# Patient Record
Sex: Male | Born: 1940 | Race: Black or African American | Hispanic: No | Marital: Married | Smoking: Former smoker
Health system: Southern US, Community
[De-identification: ages and names within clinical notes are randomized; demographics above are authoritative.]

## PROBLEM LIST (undated history)

## (undated) DIAGNOSIS — R600 Localized edema: Secondary | ICD-10-CM

## (undated) DIAGNOSIS — H9190 Unspecified hearing loss, unspecified ear: Secondary | ICD-10-CM

## (undated) DIAGNOSIS — E119 Type 2 diabetes mellitus without complications: Secondary | ICD-10-CM

## (undated) DIAGNOSIS — R21 Rash and other nonspecific skin eruption: Secondary | ICD-10-CM

## (undated) DIAGNOSIS — E785 Hyperlipidemia, unspecified: Secondary | ICD-10-CM

## (undated) DIAGNOSIS — I2510 Atherosclerotic heart disease of native coronary artery without angina pectoris: Secondary | ICD-10-CM

## (undated) DIAGNOSIS — I10 Essential (primary) hypertension: Secondary | ICD-10-CM

## (undated) DIAGNOSIS — E7800 Pure hypercholesterolemia, unspecified: Secondary | ICD-10-CM

## (undated) DIAGNOSIS — N401 Enlarged prostate with lower urinary tract symptoms: Secondary | ICD-10-CM

## (undated) DIAGNOSIS — N183 Chronic kidney disease, stage 3 unspecified: Secondary | ICD-10-CM

## (undated) DIAGNOSIS — C61 Malignant neoplasm of prostate: Secondary | ICD-10-CM

## (undated) DIAGNOSIS — L853 Xerosis cutis: Secondary | ICD-10-CM

## (undated) HISTORY — PX: CORONARY ANGIOPLASTY: SHX604

## (undated) HISTORY — PX: CATARACT EXTRACTION W/ INTRAOCULAR LENS  IMPLANT, BILATERAL: SHX1307

## (undated) HISTORY — PX: CARDIAC CATHETERIZATION: SHX172

## (undated) HISTORY — PX: CORONARY ANGIOPLASTY WITH STENT PLACEMENT: SHX49

## (undated) HISTORY — PX: PROSTATE BIOPSY: SHX241

---

## 1995-11-07 HISTORY — PX: CARDIAC CATHETERIZATION: SHX172

## 1998-03-23 ENCOUNTER — Inpatient Hospital Stay (HOSPITAL_COMMUNITY): Admission: EM | Admit: 1998-03-23 | Discharge: 1998-03-24 | Payer: Self-pay | Admitting: Emergency Medicine

## 2002-01-08 ENCOUNTER — Emergency Department (HOSPITAL_COMMUNITY): Admission: EM | Admit: 2002-01-08 | Discharge: 2002-01-08 | Payer: Self-pay | Admitting: Emergency Medicine

## 2002-01-08 ENCOUNTER — Encounter: Payer: Self-pay | Admitting: Emergency Medicine

## 2002-01-22 ENCOUNTER — Ambulatory Visit (HOSPITAL_COMMUNITY): Admission: RE | Admit: 2002-01-22 | Discharge: 2002-01-22 | Payer: Self-pay | Admitting: Pulmonary Disease

## 2002-01-22 ENCOUNTER — Encounter: Payer: Self-pay | Admitting: Pulmonary Disease

## 2002-06-03 ENCOUNTER — Inpatient Hospital Stay (HOSPITAL_COMMUNITY): Admission: EM | Admit: 2002-06-03 | Discharge: 2002-06-04 | Payer: Self-pay | Admitting: Emergency Medicine

## 2002-06-03 ENCOUNTER — Encounter: Payer: Self-pay | Admitting: Emergency Medicine

## 2002-06-04 DIAGNOSIS — Z955 Presence of coronary angioplasty implant and graft: Secondary | ICD-10-CM

## 2002-06-04 HISTORY — DX: Presence of coronary angioplasty implant and graft: Z95.5

## 2002-12-08 ENCOUNTER — Encounter: Payer: Self-pay | Admitting: Cardiology

## 2002-12-08 ENCOUNTER — Observation Stay (HOSPITAL_COMMUNITY): Admission: EM | Admit: 2002-12-08 | Discharge: 2002-12-09 | Payer: Self-pay | Admitting: *Deleted

## 2003-01-29 ENCOUNTER — Emergency Department (HOSPITAL_COMMUNITY): Admission: EM | Admit: 2003-01-29 | Discharge: 2003-01-29 | Payer: Self-pay | Admitting: Emergency Medicine

## 2003-07-27 ENCOUNTER — Encounter: Payer: Self-pay | Admitting: Emergency Medicine

## 2003-07-27 ENCOUNTER — Observation Stay (HOSPITAL_COMMUNITY): Admission: EM | Admit: 2003-07-27 | Discharge: 2003-07-28 | Payer: Self-pay | Admitting: Emergency Medicine

## 2003-07-28 ENCOUNTER — Encounter: Payer: Self-pay | Admitting: Pulmonary Disease

## 2005-03-08 ENCOUNTER — Ambulatory Visit (HOSPITAL_COMMUNITY): Admission: RE | Admit: 2005-03-08 | Discharge: 2005-03-09 | Payer: Self-pay | Admitting: Cardiology

## 2005-10-11 ENCOUNTER — Encounter: Admission: RE | Admit: 2005-10-11 | Discharge: 2005-10-11 | Payer: Self-pay | Admitting: Occupational Medicine

## 2006-05-25 ENCOUNTER — Emergency Department (HOSPITAL_COMMUNITY): Admission: EM | Admit: 2006-05-25 | Discharge: 2006-05-25 | Payer: Self-pay | Admitting: Emergency Medicine

## 2006-05-28 ENCOUNTER — Observation Stay (HOSPITAL_COMMUNITY): Admission: AD | Admit: 2006-05-28 | Discharge: 2006-05-29 | Payer: Self-pay | Admitting: Otolaryngology

## 2008-03-11 ENCOUNTER — Encounter: Admission: RE | Admit: 2008-03-11 | Discharge: 2008-03-17 | Payer: Self-pay | Admitting: Pulmonary Disease

## 2010-01-07 ENCOUNTER — Emergency Department (HOSPITAL_COMMUNITY): Admission: EM | Admit: 2010-01-07 | Discharge: 2010-01-07 | Payer: Self-pay | Admitting: Family Medicine

## 2011-03-24 NOTE — Discharge Summary (Signed)
NAME:  [name], [name]                            ACCOUNT NO.:  [id number]   MEDICAL RECORD NO.:  [id number]                   PATIENT TYPE:  INP   LOCATION:  6531                                 FACILITY:  [facility]   PHYSICIAN:  Madaline Savage, M.D.             DATE OF BIRTH:  1941/04/09   DATE OF ADMISSION:  06/03/2002  DATE OF DISCHARGE:  06/04/2002                                 DISCHARGE SUMMARY   DISCHARGE DIAGNOSES:  1. Unstable angina, catheterization this admission revealing high grade     right coronary artery disease treated with tandem stents.  2. Noninsulin-dependent diabetes with retinopathy.  3. Hypertension, improved control at discharge.  4. Treated hyperlipidemia.  5. Family history of coronary artery disease.   HOSPITAL COURSE:  The patient is a 70 year old male with history of coronary  disease.  He has had previous catheterizations by Dr. Elsie Lincoln in the mid 90s  that showed moderate RCA disease.  He presented 06/03/02 with chest pain  that sounded somewhat atypical being sharp on the left side of his chest  and localized.  On further review the patient did admit to some fatigue and  some intermittent arm discomfort over the last couple weeks.  He has  multiple risk factors for coronary disease and it was elected to proceed  with diagnostic catheterization.  He was started on aspirin and a statin and  ACE inhibitor was added.  A catheterization done 06/03/02 indicated a 90%  RCA.  This was dilated and stented.  He also had a proximal RCA stenosis of  75% that was stented.  Left main was normal.  LAD had a 40% narrowing,  circumflex 30% tapering.  LV function was normal.  Renal arteries were  normal.  Iliacs were normal.  The patient tolerated the procedure well.  He  did receive a Cipher stent.  Dr. Rosalia Hammers feels he can be discharged 06/04/02.  We will see him in the office in about two weeks.   DISCHARGE MEDICATIONS:  1. Norvasc 10 mg q.d.  2. Altace 5 mg q.d.  3. Toprol XL 50 mg q.d.  4. Aspirin 81 mg q.d.  5. Plavix 75 mg q.d.  6. Nitroglycerin sublingual p.r.n.  7. Zocor 20 mg q.d.  8. Glucovance as taken at home, he is to resume this Friday.  In the interim     he will take Glucotrol XL 5 mg q.d.   LABORATORY DATA:  INR was 1.0.  CK was 338 but MB and troponins were  negative.  Sodium 139, potassium 3.8, BUN 8, creatinine 1.1.  Liver  functions are normal.  White count 5.3, hemoglobin 14.2, hematocrit 44.5,  platelets 233,000.  Lipid profile shows cholesterol 224, triglycerides 61,  HDL 42, LDL 170.  EKG reveals sinus rhythm, left axis deviation.  Chest x-  ray shows mild peribronchial thickening.    DISPOSITION:  The patient is  discharged in stable condition.  He will follow  up with Dr. Mat Carne. on August 18th at 11:30 a.m.     Abelino Derrick, P.A.                      Madaline Savage, M.D.    Lenard Lance  D:  06/04/2002  T:  06/10/2002  Job:  [id number]   cc:   Eino Farber., M.D.   Madaline Savage, M.D.

## 2011-03-24 NOTE — Cardiovascular Report (Signed)
NAME:  [name], [name]                            ACCOUNT NO.:  [id number]   MEDICAL RECORD NO.:  [id number]                   PATIENT TYPE:  INP   LOCATION:  1829                                 FACILITY:  [facility]   PHYSICIAN:  Cristy Hilts. Jacinto Halim, M.D.                  DATE OF BIRTH:  [birth year]   DATE OF PROCEDURE:  12/08/2002  DATE OF DISCHARGE:                              CARDIAC CATHETERIZATION   PROCEDURES PERFORMED:  1. Left ventriculography.  2. Selective right and left coronary arteriography.  3. Intravascular ultrasound interrogation of the left anterior descending     artery.   INDICATION:  The patient is a 70 year old African American male with a  history of hypertension, diabetes, hyperlipidemia, who has had PTCA and  stenting of the right coronary artery about six months ago on June 03, 2002,  with two overlapping stents, presents now with a similar episode of chest  pain that he had prior to him having this angioplasty to his right coronary  artery.  This morning he woke up and while he was going to work he had  severe chest pain in exactly the same location, same intensity, and this was  on and off for pretty much a couple of hours during the work time. Given  this, he was taken directly to the emergency room.  The chest pain was  relieved with intravenous nitroglycerin and heparin. Given his multiple  cardiac risk factors and his _____ condition he is brought to the cardiac  catheterization laboratory to evaluate his coronary anatomy.  The  intravascular ultrasound interrogation was done as part of RESEARCH protocol  for coronary artery disease to evaluate his arterial lumen in the left  anterior descending artery.  The patient was enrolled in the CETP/IVUS lipid  study with [employer] (Lipitor was his investigational HDL rising drug).   HEMODYNAMIC DATA:  The left ventricular pressures were 125/80, the end-  diastolic pressure of 81 mmHg. The aortic pressure 121/83 with a  mean of 100  mmHg.  There was no pressure gradient across the aortic valve.   ANGIOGRAPHIC DATA:  LEFT VENTRICULOGRAM:  The left ventricular systolic function was normal.  Ejection fraction was estimated at 65%.  There was no regional wall motion  abnormality.  No significant mitral regurgitation.   Right coronary artery:  The right coronary artery is a dominant vessel with  a small PDA, small PLV branches. The previously placed stent in the mid  right coronary artery on June 03, 2001, were widely patent.  There was mild  luminal irregularities.   Left main coronary artery is a large caliber vessel. It is normal.   Circumflex coronary artery is a very large caliber vessel.  It gives origin  to a very large obtuse marginal #1, moderate obtuse marginal #2, and  continues in the AV groove.  Just prior to obtuse marginal #1  bifurcation  has 30-40% luminal irregularity.   Left anterior descending artery is a large caliber vessel.  It just wraps  around the apex.  The LAD gives a moderate sized diagonal #1, very large  diagonal #2 and a very small diagonal #3. The LAD has diffuse lumpy bumpy  lesions throughout extending from the mid LAD all the way up to the mid to  distal segment of the LAD constituting 30-40% stenosis.   INTRAVASCULAR ULTRASOUND DATA:  The intravascular ultrasound interrogation  of the left anterior descending artery revealed mild calcification with mild  diffuse atherosclerotic changes constituting 30 at most 40% luminal  irregularities. There was no unstable plaque. There was no significant  luminal stenosis.   RECOMMENDATIONS:  1. Continue aggressive risk factor modification as indicated.  Weight loss,     aggressive control of diabetes is indicated.  2. The patient has been enrolled in CETP/IVUS lipid study and his lipids     will be managed by the trial investigators.   TECHNIQUE OF PROCEDURE:  Under the usual sterile precautions, using a 6  French right  femoral artery access a 6 [place] multipurpose B2 catheter was  advanced into the ascending aorta over a 0.035 inch J wire.  The catheter  was gently advanced in the left ventricle and left ventricular pressures  were monitored. Hand contrast injection of the left ventricle was performed  in the LAO projection.  The catheter then was accidentally pulled back into  the ascending aorta.  The right coronary artery was then selectively engaged  and angiography was performed. Then the catheter was pushed back into the  left ventricle and left ventriculography was performed in the RAO  projection.  Then the catheter was pulled out of the body in the usual  fashion, and a 6 [place] Judkins left diagnostic catheter was then advanced  into the aorta and the left main coronary artery was selectively engaged and  angiography was performed.   TECHNIQUE OF INTRAVASCULAR ULTRASOUND INTERROGATION:  A 6 [place] FL4 guide  was advanced into the ascending aorta and left main coronary artery was  selectively engaged.  Angiography was performed.  A total of 5000 units of  intravenous heparin was administered.  After confirming the therapeutic ACT  range, a 0.014 x 180 cm luge wire was advanced into the left anterior  descending artery and the tip of the wire was then positioned in the distal  left anterior descending artery.  Then the Galaxy Scimed IVUS catheter was  advanced into the left anterior descending artery and IVUS interrogation was  performed and the data was carefully canalized.  Then the IVUS catheter was  pulled out of the body and repeat angiography was performed.  The catheter  was disengaged and pulled out of the body in the usual fashion.  The patient  tolerated the procedure well.                                                Cristy Hilts. Jacinto Halim, M.D.    Pilar Plate  D:  12/08/2002  T:  12/09/2002  Job:  [id number]   cc:   Madaline Savage, M.D. [address]  [place]  [place] [address]   Fax: [phone]   Alfonse Alpers. Dagoberto Ligas, M.D.  [address]  [place]  [place] [address]  Fax: [phone]

## 2011-03-24 NOTE — Cardiovascular Report (Signed)
NAME:  [name], [name]                            ACCOUNT NO.:  [id number]   MEDICAL RECORD NO.:  [id number]                   PATIENT TYPE:  INP   LOCATION:  6531                                 FACILITY:  [facility]   PHYSICIAN:  Runell Gess, M.D.             DATE OF BIRTH:  1941/08/24   DATE OF PROCEDURE:  DATE OF DISCHARGE:  06/04/2002                              CARDIAC CATHETERIZATION   PROCEDURE PERFORMED:  1. Cardiac catheterization.  2. PCI and stent placement.  3. Distal aortography.  4. Left ventriculography.   INDICATIONS:  The patient is a 70 year old black male admitted July 29 with  unstable angina.  He ruled out for myocardial infarction.  He had no EKG  changes.  He does have a history of remote CAD back in 1997.  Positive risk  factors.  He presents now for diagnostic coronary arteriography.   PROCEDURE DESCRIPTION:  The patient was brought to the second floor [facility] cardiac cath lab in a postabsorptive  state.  Valium 10 mcg.  His right  groin was prepped and shaved in usual sterile fashion.  1% Xylocaine was  used for local anesthesia.  A 6 French sheath was inserted into the right  femoral artery using standard Seldinger technique.  A #6 French right and  left Judkins catheters and a #6 French pigtail catheter were used for  coronary angiography, left ventriculography, and distal abdominal  aortography.  Omnipaque dye was used for the entirety of the case.  The  aortic, ventricular, and pulmonic pressures were recorded.   HEMODYNAMICS:  1. Aortic systolic pressure 153, diastolic pressure 88.  2. Left ventricular systolic pressure 163 and diastolic pressure 25.   CORONARY ANGIOGRAPHY:  1. Left main:  Normal.  2. LAD:  The LAD had a 40% segmental mid stenosis after the  takeoff of the     first diagonal branch, which showed a 40 to 50% ostial stenosis.  3. Left circumflex:  The circumflex had a 30% segmental stenosis in its     midportion.  4. Right  coronary artery:  It is a large dominant vessel with 75% proximal     stenosis followed by a 90% mid stenosis.   LEFT VENTRICULOGRAPHY:  RAO left ventriculogram was performed using 25 cc of  Omnipaque dye at 12 cc per second.  The overall LVEF was greater than 60%  without focal wall motion abnormalities.   DISTAL ABDOMINAL AORTOGRAPHY:  Distal abdominal aortogram was performed  using 20 cc of Omnipaque dye at 20 cc per second.  The renal arteries were  widely patent.  Infrarenal abdominal aorta was free of significant  atherosclerotic intimal changes.   IMPRESSION:  High grade proximal and mid RCA disease progressed from  previous catheterization performed four years ago.  We will proceed with PCI  using double armed stents and adjunctive glycoprotein IIb/IIIa  inhibition.   DESCRIPTION  OF PROCEDURE:  An existing 6 French sheath in the right femoral  artery was exchanged for a 7 [place] sheath.  A 6 French sheath was then  inserted into the right femoral vein.  Using a 7 [place] guide catheter  with side holes and a 0.014 Sport guidewire  and 2 x15 Maverick balloon  predilatation was performed on the mid and proximal RCA lesions.  The  patient received a total of 6,000 units of heparin with ending ACT of  greater than 200.  He received aspirin and Plavix 150 mg p.o., as well as  Integrilin double dose infusion.  Retrograde aortic pressure is monitored at  the end of the case.  He received 200 mcg of intercoronary nitroglycerin  after stent deployment.   The mid RCA lesion was stented with a 30 x 18 CYPHER stent at 43 atmospheres  and the proximal with a 13 CYPHER stent at 15 atmospheres.  Predilatation  was performed on the proximal and midportion with a 3.25 x 12 Quantum  Maverick at 50 atmospheres.  Results in reduction and proximal 75% and mid  90% RCA stenosis to 0% residual.  The patient tolerated the procedure well  without hemodynamic or electrocardiographic sequelae.    IMPRESSION:  Successful proximal and mid dominant right coronary artery PCI  and stenting using double armed stents and adjunctive glycoprotein IIa/IIIb  inhibition.  The guide wire and catheters were removed.  The sheaths were  securely in place.  The patient left the lab in stable condition.  Plans  will be to remove the sheaths ____________150.  Integrilin _______ for 18  hours.  The patient will be treated with aspirin and Plavix.  He left the  lab in stable condition.                                               Runell Gess, M.D.    JJB/MEDQ  D:  06/03/2002  T:  06/09/2002  Job:  [id number]   cc:   Runell Gess, M.D.   Madaline Savage, M.D.   [facility]  [address]

## 2011-03-24 NOTE — Cardiovascular Report (Signed)
[name], [name]                  ACCOUNT NO.:  [id number]   MEDICAL RECORD NO.:  [id number]          PATIENT TYPE:  OIB   LOCATION:  2899                         FACILITY:  [facility]   PHYSICIAN:  Madaline Mays, M.D.DATE OF BIRTH:  05/15/1941   DATE OF PROCEDURE:  03/08/2005  DATE OF DISCHARGE:                              CARDIAC CATHETERIZATION   PROCEDURES PERFORMED:  1.  Selective coronary angiography by Judkins technique.  2.  Intravascular ultrasound as mandated by the protocol of the CETP  study      by [employer].  3.  Retrograde left heart catheterization.  4.  Left ventricular angiography.  5.  Percutaneous cutting balloon angioplasty of the mid right coronary      artery for in-stent restenosis.   COMPLICATIONS:  None.   ENTRY SITE:  Right femoral.   DYE USED:  Omnipaque.   MEDICATIONS GIVEN:  Bivalirudin intravenously during case which produced an  ACT of 302 seconds, fentanyl and Versed for sedation.   COMPLICATIONS:  None.   PATIENT PROFILE:  The patient is a very pleasant 70 year old African-  American gentleman with known coronary disease and diabetes who has had  percutaneous intervention to his coronary artery since the Year 2003,  at  that time he underwent an RCA stent in July 2003, a repeat catheterization  by Dr. Jacinto Mays in 2004 showed a 50% LAD stenosis and no evidence of in-stent  restenosis of the right coronary artery and a 30-40% circumflex narrowing.  He was enrolled in the CETP study by [facility] in which patients are  given Lipitor and randomized to a HDL lowering agent versus placebo. He had  a negative Cardiolite in September 2004 but has recently had episodes of  occasional chest pain that have been nitrate responsive. His outpatient  medications include Norvasc, Toprol, Altace, aspirin, Plavix, Glucovance and  Actos. He has no known drug allergies. The patient presents today for  outpatient cardiac cath. It was completed  along with intravascular  ultrasound of the LAD and percutaneous cutting balloon angioplasty of the  RCA without complication.   RESULTS:  LV pressure was 160/3, end-diastolic pressure 18, central aortic  pressure 160/75, mean of 110. No aortic valve gradient by pullback  technique.   ANGIOGRAPHIC RESULTS:  The patient had trivial coronary calcification. He  had a normal left main that was large in circumference and short in length.   The LAD coursed through the cardiac apex and bifurcated, it gave rise to two  septal perforator branches near the origin of a second large diagonal  branch, there was a 50% stenosis in the LAD between septal perforator branch  #1 and #2 so that would make it mid LAD, there was lumpy bumpy irregularity  throughout the vessel, however.   The circumflex coronary artery was a nondominant vessel. It gave rise to a  bifurcating obtuse marginal branch #1, a second smaller obtuse marginal  branch #2 and then the circumflex itself was small as it coursed through the  atrioventricular groove, there was a 50% stenosis in the mid circumflex just  before origin of the first obtuse marginal branch. It was smooth.   The right coronary artery contained a radio-opaque stent  beginning at the  end of the proximal and primarily in the mid RCA that was known clinically  to be two overlapping Cypher stents, this stent arose before the pulmonary  conus branch which was in the proximal portion of vessel but the majority of  the stent was in the mid RCA. The distal RCA showed smooth contour to the  distal RCA and PDA. The ostium of the posterolateral contained a 50% ostial  area of narrowing. There was in-stent restenosis at what appeared to be the  junction between the proximal and the distal Cypher stent that was  eccentric and 75-80% severe.   The left ventricular angiogram showed good contractility of all wall  segments with ejection fraction estimate of 60-65% with some  mild catheter  induced mitral regurgitation but none of any significance. No LV thrombus  was noted. No prolapse of the mitral valve was seen.  Aortic root appeared  upper limits of normal in terms of size.  Intravascular ultrasound was then  performed on the LAD with a 6-French left Judkins 3.5 guide catheter without  side-holes using a Patriot wire and the Atlantis balloon.   The reference vessel of the LAD showed a diameter of approximately 2 with a  reference vessel in the mid LAD well distal to the second septal perforator  branch of approximately 2.4 x 2.5, the mid LAD between septal perforator #1  and #2 showed a 50% area of eccentric plaque between 2 o'clock and 5 o'clock  that was a crescent shaped, there were luminal irregularities and minimal  plaque throughout the mid and proximal LAD.   The percutaneous coronary intervention of the RCA was unsuccessfully  performed with a Judkins right guide because of poor backup and was  successfully performed with a 6-French RCD-4 and a medium support choice PT  guidewire. I was able to pass a 3 x 15 mm cutting balloon device across the  area of stenosis and made a 9 mm inflation with the cutting balloon twice  with marked improvement of the 80% stenosis. We then used a 3.25 cutting  balloon angioplasty and again inflated to 9 atmospheres on two occasions and  the initial stenosis of 80% was reduced to 10% with excellent preservation  of TIMI III flow.   The patient had minimal chest pain but did have ST-segment elevation during  the cutting balloon angioplasty of the RCA.   Angiomax was discontinued at the completion of the case and the patient was  taken to recovery with stable vital signs. The patient's blood pressures  have been elevated at the end of the case because of the need to evacuate  his bladder. The blood pressure was improved after he voided.  FINAL DIAGNOSES:  1.  Coronary artery disease with known mild three-vessel  disease as far back      as 2003.  2.  Status post two stents to the right coronary artery by Dr. Jacinto Mays on June 03, 2002.  3.  Recent chest pain.  4.  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  5.  Successful cutting balloon angioplasty of the in-stent restenosis in mid      right coronary artery with reduction of 80% lesion  to 10% residual.  6.  Intravascular ultrasound of the left anterior descending showing an area      of 50% eccentric stenosis and mid left anterior descending between      septal perforator branch #1 and #2.   PLAN:  The patient will continue on aspirin and Plavix with blood pressure  control and continued attempts to control his diabetes well. He will be held  overnight and discharged tomorrow.      WHG/MEDQ  D:  03/08/2005  T:  03/08/2005  Job:  [id number]   cc:   [facility]  [place], [place]   [facility]  c/o [facility]   Cardiac Cath Lab [facility]   Medical Records [place]   Eino Farber., M.D.  [address] [place]  [place] [address]  Fax: [phone]

## 2011-03-24 NOTE — H&P (Signed)
NAME:  [name], [name]                            ACCOUNT NO.:  [id number]   MEDICAL RECORD NO.:  [id number]                   PATIENT TYPE:  INP   LOCATION:  6531                                 FACILITY:  [facility]   PHYSICIAN:  Abelino Derrick, P.A.                DATE OF BIRTH:  04-12-1941   DATE OF ADMISSION:  06/03/2002  DATE OF DISCHARGE:                                HISTORY & PHYSICAL   CHIEF COMPLAINT:  Chest pain.   HISTORY OF PRESENT ILLNESS:  The patient is a 70 year old male followed by  Dr. Amada Jupiter and Dr. Elsie Lincoln.  He has a history of prior catheterization  showing moderate coronary disease about five years ago.  He presented to the  emergency room on June 03, 2002, with chest pain.  The patient says he had  an episode of sharp, left chest pain about 1:30 a.m. that lasted several  seconds.  He had no associated shortness of breath or nausea or vomiting.  He does admit to some increasing fatigue over the last few weeks.  He has  multiple risk factors for coronary disease including diabetes, hypertension  and hyperlipidemia.  He is admitted now for further evaluation.   PAST MEDICAL HISTORY:  1. Non-insulin dependent diabetes mellitus.  2. Hypertension.  3. Hyperlipidemia, controlled with diet.  4. No prior surgeries.  5. Pneumonia in March 2003.   CURRENT MEDICATIONS:  1. Norvasc, recently increased to 10 mg a day.  2. Toprol XL 50 mg a day.  3. Glucovance.  4. Aspirin.   ALLERGIES:  No known drug allergies.   SOCIAL HISTORY:  He is a nonsmoker.  He works at [employer].  He is married  with two children and four grandchildren.   FAMILY HISTORY:  Remarkable for coronary disease.  His brother has had  bypass surgery.   REVIEW OF SYSTEMS:  Remarkable for retinopathy.  He has had prior laser  surgery.  He has mild peripheral neuropathy.  There is no history of  nephropathy.  He has reflux symptoms, but no history of peptic ulcer disease  or GI bleeding.  No history  of prostate disease or kidney trouble.  Review  of systems otherwise unremarkable except for as noted above.   PHYSICAL EXAMINATION:  VITAL SIGNS:  Blood pressure 184/98, pulse 58,  temperature 97.6.   GENERAL:  He is a well-developed, well-nourished, African-American male in  no acute distress.   NECK:  Without bruit or JVD.   CHEST:  Clear to auscultation and percussion.   CARDIAC:  Regular rate and rhythm without murmur, rub or gallop, normal S1,  S2.   ABDOMEN:  Nontender, no hepatosplenomegaly, no bruits.   EXTREMITIES:  Without edema.  Pulses are 3+/4 bilaterally.  No femoral  bruits noted.   NEUROLOGIC:  Grossly intact.  He is awake, alert and oriented and  cooperative.  Moves all extremities without  obvious deficit.   HEENT:  Normocephalic, extraocular movements intact.  Sclerae nonicteric.   LABORATORY DATA AND X-RAY FINDINGS:  EKG shows normal sinus rhythm without  acute changes.  Initial labs are within normal limits.  He does have a  mildly elevated CK of 338, but MBs and troponins are negative.  Interestingly, this is similar to previous presentations.   IMPRESSION:  1. Chest pain, rule out coronary disease.  2. Coronary disease with previous catheterizations five years ago showing     moderate degree of disease.  3. Non-insulin dependent diabetes mellitus with retinopathy.  4. Uncontrolled hypertension.  5. Hyperlipidemia, diet controlled.  6. Family history of coronary artery July 29, 2003disease.   PLAN:  1. Will give the patient one dose of clonidine now to help get his blood     pressure down.  2. Will add ACE inhibitor with his history of diabetes, hypertension,     coronary disease and renal function.  3. Will go ahead and add Statin and check a lipid profile.  4. The patient will be seen today to decide whether he needs another     catheterization.                                               Abelino Derrick, P.ALenard Lance  D:  06/03/2002   T:  06/04/2002  Job:  [id number]

## 2011-03-24 NOTE — Discharge Summary (Signed)
[name], [name]                  ACCOUNT NO.:  [id number]   MEDICAL RECORD NO.:  [id number]          PATIENT TYPE:  OIB   LOCATION:  6526                         FACILITY:  [facility]   PHYSICIAN:  Madaline Savage, M.D.DATE OF BIRTH:  11/07/1940   DATE OF ADMISSION:  03/08/2005  DATE OF DISCHARGE:  03/09/2005                                 DISCHARGE SUMMARY   DISCHARGE DIAGNOSES:  1.  Coronary artery disease, status post procedure during this admission.  2.  Normal left ventricular function.  3.  Noninsulin-dependent diabetes mellitus.  4.  Hypertension.  5.  Dyslipidemia.  6.  Enrolled in CTB study.  7.  Gastroesophageal reflux disease.   HISTORY OF PRESENT ILLNESS AND HOSPITAL COURSE:  This is a 70 year old,  African-American gentleman who was see in our office on February 23, 2005.  He  has a history of coronary artery disease and is status post RCA stent placed  in July 2003.  The last study was in 2004 by Dr. Jacinto Halim, and it showed 50%  LAD stenosis, and no significant in-stent restenosis of the RCA.  Also, he  had irregularities, with 30-40% narrowing in the circumflex.  He was seen in  the office, and the patient is enrolled in the CTB study, and the decision  was made to admit him for elective cardiac catheterization to evaluate his  complaints of shortness of breath and see where the patient stands in terms  of disease progression after two years since last catheterization.   He was brought to the short stay unit as an outpatient to [facility] on Mar 08, 2005.   HOSPITAL PROCEDURE:  Cardiac catheterization was performed by Dr. Elsie Lincoln on  Mar 08, 2005.  It revealed in-stent restenosis of the RCA 80%, and Dr. Elsie Lincoln  performed cutting balloon PCI, with reduction of in-stent restenosis from  80% to less than 10%.  The patient tolerated the procedure well.  His  condition was stable upon transfer to the unit.  He was given Angiomax with  the procedure and maintained  stable condition throughout his  hospitalization.   The next morning, he was found to be in stable condition, with normal vital  signs, and seemed to be okay for discharge home.   MEDICATIONS:  1.  Norvasc 10 mg daily.  2.  Toprol 10 mg daily.  3.  Plavix 75 mg daily.  4.  Glucovance 2.5/500 mg daily, restart on Saturday.  5.  Actos 15 mg daily.  6.  Aspirin 81 mg daily.   HOSPITAL LABORATORIES:  BMP on the day of discharge revealed sodium 138,  potassium 3.4, chloride 102, CO2 29, BUN 7, creatinine 1.2, glucose 85.  CBC  showed white blood cell count 5.7, hemoglobin 15.3, hematocrit 40.1,  platelet count 269.  His total cholesterol was 139, triglycerides 46, HDL  57, and LDL 73.   DISCHARGE INSTRUCTIONS:  No driving.  No lifting greater than 5 pounds.  No  strenuous activities for three days after discharge.   DISCHARGE DIET:  Lowfat, low cholesterol diet.   The  patient was allowed to shower and instructed to report any problems with  groin site to our office, and phone number was provided.   FOLLOWUP:  Dr. Elsie Lincoln will see the patient on Mar 30, 2005 at 11:00 a.m.  [facility] [employer] gave him an appointment since he was enrolled with [facility]  [employer] in the research study, CTB, and he will be seen on Mar 10, 2005 at 8:30  a.m.      MK/MEDQ  D:  03/09/2005  T:  03/09/2005  Job:  [id number]   cc:   Madaline Savage, M.D.  [address], [address]  [place]  [place] [address]  Fax: [phone]

## 2011-03-24 NOTE — Cardiovascular Report (Signed)
   NAME:  [name], [name]                            ACCOUNT NO.:  [id number]   MEDICAL RECORD NO.:  [id number]                   PATIENT TYPE:  INP   LOCATION:  1829                                 FACILITY:  [facility]   PHYSICIAN:  Cristy Hilts. Jacinto Halim, M.D.                  DATE OF BIRTH:  11/16/1940   DATE OF PROCEDURE:  12/08/2002  DATE OF DISCHARGE:                              CARDIAC CATHETERIZATION   Please note, document #[id number] I dictated just a few minutes ago.  Please  send a copy of that report to Dr. Corine Shelter.  Thank you very much, I  hope you can do that.                                               Cristy Hilts. Jacinto Halim, M.D.    Pilar Plate  D:  12/08/2002  T:  12/09/2002  Job:  [id number]

## 2014-03-02 ENCOUNTER — Other Ambulatory Visit (HOSPITAL_COMMUNITY): Payer: Self-pay | Admitting: Pulmonary Disease

## 2014-03-02 DIAGNOSIS — K209 Esophagitis, unspecified without bleeding: Secondary | ICD-10-CM

## 2014-03-02 DIAGNOSIS — IMO0001 Reserved for inherently not codable concepts without codable children: Secondary | ICD-10-CM

## 2014-03-02 DIAGNOSIS — K219 Gastro-esophageal reflux disease without esophagitis: Principal | ICD-10-CM

## 2014-03-10 ENCOUNTER — Ambulatory Visit (HOSPITAL_COMMUNITY): Payer: Self-pay

## 2014-03-16 ENCOUNTER — Ambulatory Visit (HOSPITAL_COMMUNITY)
Admission: RE | Admit: 2014-03-16 | Discharge: 2014-03-16 | Disposition: A | Discharge status: Home / Self Care | Payer: Medicare HMO | Source: Ambulatory Visit | Attending: Pulmonary Disease | Admitting: Pulmonary Disease

## 2014-03-16 DIAGNOSIS — K219 Gastro-esophageal reflux disease without esophagitis: Secondary | ICD-10-CM | POA: Insufficient documentation

## 2014-03-16 DIAGNOSIS — K209 Esophagitis, unspecified without bleeding: Secondary | ICD-10-CM

## 2014-03-16 DIAGNOSIS — IMO0001 Reserved for inherently not codable concepts without codable children: Secondary | ICD-10-CM

## 2015-05-11 ENCOUNTER — Emergency Department (HOSPITAL_COMMUNITY)
Admission: EM | Admit: 2015-05-11 | Discharge: 2015-05-11 | Disposition: A | Discharge status: Home / Self Care | Payer: Medicare HMO | Source: Home / Self Care | Attending: Family Medicine | Admitting: Family Medicine

## 2015-05-11 ENCOUNTER — Encounter (HOSPITAL_COMMUNITY): Payer: Self-pay | Admitting: Emergency Medicine

## 2015-05-11 DIAGNOSIS — J018 Other acute sinusitis: Secondary | ICD-10-CM | POA: Diagnosis not present

## 2015-05-11 MED ORDER — AMOXICILLIN-POT CLAVULANATE 875-125 MG PO TABS: 1.0000 | ORAL_TABLET | Freq: Two times a day (BID) | ORAL | Status: DC

## 2015-05-11 NOTE — ED Provider Notes (Signed)
CSN: [id number]     Arrival date & time 05/11/15  1431 History   First MD Initiated Contact with Patient 05/11/15 1636     Chief Complaint  Patient presents with  . Epistaxis  . Sinusitis   HPI  Patient states that he has been having URI-like symptoms for the last 2 days. This morning woke up with epistaxis of which stopped after he packed his nose. Patient denies any fevers or chills. The problem since onset has been persistent and gradually getting worse such patient presented for further evaluation recommendations. Nothing he is aware of makes it better or worse. He has not tried any medications for improvement in his condition  History reviewed. No pertinent past medical history. History reviewed. No pertinent past surgical history. Family history - None reported History  Substance Use Topics  . Smoking status: Not on file  . Smokeless tobacco: Not on file  . Alcohol Use: Not on file    Review of Systems  Allergies  Review of patient's allergies indicates no known allergies.  Home Medications   Prior to Admission medications   Not on File   BP 151/73 mmHg  Pulse 74  Temp(Src) 97.9 F (36.6 C) (Oral)  Resp 16  SpO2 100% Physical Exam  General: Alert, awake, oriented x3, in no acute distress. HEENT: No bruits, no goiter, at nasopharynx patient has erythema at left nostril with no active bleeding Heart: Regular rate and rhythm, without murmurs, rubs, gallops. Lungs: Clear to auscultation bilaterally. Abdomen: Soft, nontender, nondistended, positive bowel sounds. Extremities: No clubbing cyanosis or edema with positive pedal pulses. Neuro: Grossly intact, nonfocal.    ED Course  Procedures (including critical care time) Labs Review Labs Reviewed - No data to display  Imaging Review No results found.   MDM  No diagnosis found. Sinusitis - Will provide script for augmentin - recommended allergy medication like loratadine - Recommend patient f/u with pcp to  obtain cbc to assess platelet function although given reported symptoms of URI I suspect this may be most likely related to infectious etiology    Velvet Bathe, MD 05/11/15 [phone]

## 2015-05-11 NOTE — Discharge Instructions (Signed)
Sinusitis °Sinusitis is redness, soreness, and puffiness (inflammation) of the air pockets in the bones of your face (sinuses). The redness, soreness, and puffiness can cause air and mucus to get trapped in your sinuses. This can allow germs to grow and cause an infection.  °HOME CARE  °· Drink enough fluids to keep your pee (urine) clear or pale yellow. °· Use a humidifier in your home. °· Run a hot shower to create steam in the bathroom. Sit in the bathroom with the door closed. Breathe in the steam 3-4 times a day. °· Put a warm, moist washcloth on your face 3-4 times a day, or as told by your doctor. °· Use salt water sprays (saline sprays) to wet the thick fluid in your nose. This can help the sinuses drain. °· Only take medicine as told by your doctor. °GET HELP RIGHT AWAY IF:  °· Your pain gets worse. °· You have very bad headaches. °· You are sick to your stomach (nauseous). °· You throw up (vomit). °· You are very sleepy (drowsy) all the time. °· Your face is puffy (swollen). °· Your vision changes. °· You have a stiff neck. °· You have trouble breathing. °MAKE SURE YOU:  °· Understand these instructions. °· Will watch your condition. °· Will get help right away if you are not doing well or get worse. °Document Released: 04/10/2008 Document Revised: 07/17/2012 Document Reviewed: 05/28/2012 °ExitCare® Patient Information ©2015 [employer]. This information is not intended to replace advice given to you by your health care provider. Make sure you discuss any questions you have with your health care provider. ° °

## 2015-05-11 NOTE — ED Notes (Signed)
Pt comes in with frequent nosebleeds since last week with sinus sx's Cough, post nasal drip and feeling faint today

## 2017-01-16 ENCOUNTER — Emergency Department (HOSPITAL_BASED_OUTPATIENT_CLINIC_OR_DEPARTMENT_OTHER)
Admission: EM | Admit: 2017-01-16 | Discharge: 2017-01-17 | Disposition: A | Discharge status: Home / Self Care | Payer: Medicare HMO | Attending: Emergency Medicine | Admitting: Emergency Medicine

## 2017-01-16 ENCOUNTER — Encounter (HOSPITAL_BASED_OUTPATIENT_CLINIC_OR_DEPARTMENT_OTHER): Payer: Self-pay

## 2017-01-16 DIAGNOSIS — Z87891 Personal history of nicotine dependence: Secondary | ICD-10-CM | POA: Diagnosis not present

## 2017-01-16 DIAGNOSIS — Z955 Presence of coronary angioplasty implant and graft: Secondary | ICD-10-CM | POA: Insufficient documentation

## 2017-01-16 DIAGNOSIS — Y939 Activity, unspecified: Secondary | ICD-10-CM | POA: Insufficient documentation

## 2017-01-16 DIAGNOSIS — E119 Type 2 diabetes mellitus without complications: Secondary | ICD-10-CM | POA: Insufficient documentation

## 2017-01-16 DIAGNOSIS — Y999 Unspecified external cause status: Secondary | ICD-10-CM | POA: Insufficient documentation

## 2017-01-16 DIAGNOSIS — X58XXXA Exposure to other specified factors, initial encounter: Secondary | ICD-10-CM | POA: Diagnosis not present

## 2017-01-16 DIAGNOSIS — M7989 Other specified soft tissue disorders: Secondary | ICD-10-CM | POA: Diagnosis present

## 2017-01-16 DIAGNOSIS — I10 Essential (primary) hypertension: Secondary | ICD-10-CM | POA: Diagnosis not present

## 2017-01-16 DIAGNOSIS — Z792 Long term (current) use of antibiotics: Secondary | ICD-10-CM | POA: Diagnosis not present

## 2017-01-16 DIAGNOSIS — Y929 Unspecified place or not applicable: Secondary | ICD-10-CM | POA: Insufficient documentation

## 2017-01-16 DIAGNOSIS — R21 Rash and other nonspecific skin eruption: Secondary | ICD-10-CM | POA: Diagnosis not present

## 2017-01-16 DIAGNOSIS — S80822A Blister (nonthermal), left lower leg, initial encounter: Secondary | ICD-10-CM | POA: Diagnosis not present

## 2017-01-16 DIAGNOSIS — M79662 Pain in left lower leg: Secondary | ICD-10-CM | POA: Diagnosis not present

## 2017-01-16 DIAGNOSIS — M79605 Pain in left leg: Secondary | ICD-10-CM

## 2017-01-16 HISTORY — DX: Type 2 diabetes mellitus without complications: E11.9

## 2017-01-16 HISTORY — DX: Essential (primary) hypertension: I10

## 2017-01-16 HISTORY — DX: Pure hypercholesterolemia, unspecified: E78.00

## 2017-01-16 NOTE — ED Triage Notes (Signed)
Pt c/o bilateral lower leg swelling for months with fluid filled blister type wounds on each.  Yesterday the ones on his left lower leg opened up.  He denies SOB, denies chest pain, has no difficulty ambulating.

## 2017-01-16 NOTE — ED Provider Notes (Signed)
[name] Provider Note   CSN: [id number] Arrival date & time: 01/16/17  1904   By signing my name below, I, [name], attest that this documentation has been prepared under the direction and in the presence of [name], [place]. Electronically Signed: Eunice Mays, Scribe. 01/16/17. 11:31 PM.   History   Chief Complaint Chief Complaint  Patient presents with  . Leg Pain   The history is provided by the patient and medical records. No language interpreter was used.    HPI Comments: [name] is a 76 y.o. male with Hx of angioplasty, DM and HTN who presents to the Emergency Department complaining of waxing and waning, gradually worsening bilateral lower extremity swelling x 3 months. He notes associated bilateral lower extremity "shooting" pain and fluid-filled bumps to bilateral lower extremities with clear drainage from the bumps on the LLE beginning yesterday. He describes the leg pain as "needles shooting throughout" that is 6/10 at its height and 2/10 currently. Pt states he used to sustain trauma to the lower left leg often at work before he retired years ago. Hx of stent placement, reportedly ~50 years ago, performed by [facility]. He reports he is taking metformin, lasix, and ibuprofen 800 at home as advised. Pt adds an upcoming appointment with his PCP on 03/05/2017, whom he state he sees 3 times per year. He further notes his PCP is unaware of drainage to his legs. Pt denies fevers, chills, N/V/D, SOB, chest pain, abdominal pain, recent trauma to either extremity, medication change and gait change.  Past Medical History:  Diagnosis Date  . Diabetes mellitus without complication ([place])   . High cholesterol   . Hypertension     There are no active problems to display for this patient.   Past Surgical History:  Procedure Laterality Date  . CORONARY ANGIOPLASTY WITH STENT PLACEMENT  2003?       Home Medications    Prior to Admission  medications   Medication Sig Start Date End Date Taking? Authorizing Provider  amoxicillin-clavulanate (AUGMENTIN) 875-125 MG per tablet Take 1 tablet by mouth every 12 (twelve) hours. 05/11/15   Velvet Bathe, MD    Family History No family history on file.  Social History Social History  Substance Use Topics  . Smoking status: Former Research scientist (life sciences)  . Smokeless tobacco: Never Used  . Alcohol use No     Allergies   Patient has no known allergies.   Review of Systems Review of Systems  Constitutional: Negative for chills and fever.  Respiratory: Negative for cough and shortness of breath.   Cardiovascular: Positive for leg swelling. Negative for chest pain.  Gastrointestinal: Negative for abdominal pain, diarrhea, nausea and vomiting.  Musculoskeletal: Negative for gait problem.  Skin: Positive for wound.  All other systems reviewed and are negative.    Physical Exam Updated Vital Signs BP 139/67   Pulse 70   Temp 98 F (36.7 C) (Oral)   Resp 18   Ht 5\' 11"  (1.803 m)   Wt 240 lb (108.9 kg)   SpO2 99%   BMI 33.47 kg/m   Physical Exam  Constitutional: He is oriented to person, place, and time. He appears well-developed and well-nourished. No distress.  Well-appearing  HENT:  Head: Normocephalic and atraumatic.  Mouth/Throat: Oropharynx is clear and moist.  Eyes: EOM are normal. Pupils are equal, round, and reactive to light.  Neck: Normal range of motion. Neck supple. No JVD present.  Cardiovascular: Normal rate, normal heart sounds  and intact distal pulses.  Exam reveals no gallop and no friction rub.   No murmur heard. Pulmonary/Chest: Effort normal and breath sounds normal. No respiratory distress. He has no wheezes.  Abdominal: Soft. Bowel sounds are normal. He exhibits no distension. There is no tenderness. There is no rebound and no guarding.  Musculoskeletal: Normal range of motion. He exhibits edema and tenderness. He exhibits no deformity.  Swelling noted to  bilateral lower extremities; mild TTP to the lower left extremity, multiple fluid filled bumps to lower left extremity, mild erythema  Neurological: He is alert and oriented to person, place, and time.  Sensation intact distally to the lower extremity bilaterally, muscle strength 5/5 bilaterally to lower extremities against resistance to plantar flexion and dorsal flexion; Pt able to stand and walk without difficulty  Skin: He is not diaphoretic. No pallor.  Psychiatric: He has a normal mood and affect. His behavior is normal.  Nursing note and vitals reviewed.      ED Treatments / Results  DIAGNOSTIC STUDIES: Oxygen Saturation is 99% on RA, normal by my interpretation.    COORDINATION OF CARE: 11:27 PM Discussed treatment plan with pt at bedside and pt agreed to plan. Will order labs and reassess.  Labs (all labs ordered are listed, but only abnormal results are displayed) Labs Reviewed  COMPREHENSIVE METABOLIC PANEL - Abnormal; Notable for the following:       Result Value   Potassium 3.3 (*)    Glucose, Bld 114 (*)    Creatinine, Ser 1.25 (*)    Total Protein 8.6 (*)    GFR calc non Af Amer 55 (*)    All other components within normal limits  CBC - Abnormal; Notable for the following:    Hemoglobin 12.4 (*)    All other components within normal limits  I-STAT CG4 LACTIC ACID, ED    EKG  EKG Interpretation None       Radiology No results found.  Procedures Procedures (including critical care time)  Medications Ordered in ED Medications  bacitracin ointment (1 application Topical Given 01/17/17 0105)     Initial Impression / Assessment and Plan / ED Course  I have reviewed the triage vital signs and the nursing notes.  Pertinent labs & imaging results that were available during my care of the patient were reviewed by me and considered in my medical decision making (see chart for details).      Patient here with blistering of his skin of his lower left leg.  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  Low suspicion for cellulitis, DVT, pemphigus vulgaris, erythema multiforme, SJS, or TEN. On exam patient is afebrile, hemodynamically stable, and no apparent distress.  Patient had good sensation distal to the lesion. Good muscle strength against resistance on plantarflexion and dorsiflexion distal to the lesion. Distal pulses intact. Patient able to stand and ambulate without difficulty. Wells score for DVT -1. Lab work is shows no increase in WBC. Patient encouraged to follow up with primary care provider within one week regarding today's visit. Patient is agreeable with discharge and verbally understands assessment and plan. Reasons to immediately return to the emergency department discussed.  Pt also seen and evaluated by Dr. Lita Mains who agreed with assessment and plan.   I personally performed the services described in this documentation, which was scribed in my presence. The recorded information has been reviewed and is accurate.  Final Clinical Impressions(s) / ED Diagnoses   Final diagnoses:  Left leg pain  Acute blistering eruption  of skin    New Prescriptions Discharge Medication List as of 01/17/2017 12:49 AM       [place], [place] 01/17/17 0132    Julianne Rice, MD 01/19/17 [phone]

## 2017-01-17 LAB — COMPREHENSIVE METABOLIC PANEL
ALK PHOS: 107 U/L (ref 38–126)
ALT: 20 U/L (ref 17–63)
AST: 21 U/L (ref 15–41)
Albumin: 4 g/dL (ref 3.5–5.0)
Anion gap: 7 (ref 5–15)
BILIRUBIN TOTAL: 1 mg/dL (ref 0.3–1.2)
BUN: 17 mg/dL (ref 6–20)
CALCIUM: 8.9 mg/dL (ref 8.9–10.3)
CO2: 30 mmol/L (ref 22–32)
CREATININE: 1.25 mg/dL — AB (ref 0.61–1.24)
Chloride: 102 mmol/L (ref 101–111)
GFR, EST NON AFRICAN AMERICAN: 55 mL/min — AB (ref >60)
Glucose, Bld: 114 mg/dL — ABNORMAL HIGH (ref 65–99)
Potassium: 3.3 mmol/L — ABNORMAL LOW (ref 3.5–5.1)
SODIUM: 139 mmol/L (ref 135–145)
TOTAL PROTEIN: 8.6 g/dL — AB (ref 6.5–8.1)

## 2017-01-17 LAB — CBC
HCT: 39.1 % (ref 39.0–52.0)
Hemoglobin: 12.4 g/dL — ABNORMAL LOW (ref 13.0–17.0)
MCH: 26.8 pg (ref 26.0–34.0)
MCHC: 31.7 g/dL (ref 30.0–36.0)
MCV: 84.4 fL (ref 78.0–100.0)
PLATELETS: 263 10*3/uL (ref 150–400)
RBC: 4.63 MIL/uL (ref 4.22–5.81)
RDW: 15.3 % (ref 11.5–15.5)
WBC: 5.1 10*3/uL (ref 4.0–10.5)

## 2017-01-17 MED ORDER — BACITRACIN ZINC 500 UNIT/GM EX OINT
TOPICAL_OINTMENT | Freq: Two times a day (BID) | CUTANEOUS | Status: DC
  Administered 2017-01-17: 1 via TOPICAL

## 2017-01-17 NOTE — Discharge Instructions (Signed)
Keep area clean and dry. He can apply bacitracin or Neosporin to the area with a wrap. Please follow-up with your primary care provider within a week regarding today's visit.   Contact a health care provider if: You have more redness, swelling, or pain around your blister. You have more fluid or blood coming from your blister. Your blister feels warm to the touch. You have pus or a bad smell coming from your blister. You have a fever or chills. Your blister gets better and then it gets worse.

## 2017-07-17 ENCOUNTER — Other Ambulatory Visit (HOSPITAL_COMMUNITY): Payer: Self-pay | Admitting: Pulmonary Disease

## 2017-07-17 DIAGNOSIS — R609 Edema, unspecified: Secondary | ICD-10-CM

## 2017-07-23 ENCOUNTER — Ambulatory Visit (HOSPITAL_COMMUNITY)
Admission: RE | Admit: 2017-07-23 | Discharge: 2017-07-23 | Disposition: A | Discharge status: Home / Self Care | Payer: Medicare HMO | Source: Ambulatory Visit | Attending: Pulmonary Disease | Admitting: Pulmonary Disease

## 2017-07-23 ENCOUNTER — Other Ambulatory Visit (HOSPITAL_COMMUNITY): Payer: Self-pay | Admitting: Pulmonary Disease

## 2017-07-23 DIAGNOSIS — R011 Cardiac murmur, unspecified: Secondary | ICD-10-CM

## 2017-07-23 DIAGNOSIS — M7989 Other specified soft tissue disorders: Secondary | ICD-10-CM | POA: Diagnosis present

## 2017-07-23 DIAGNOSIS — R609 Edema, unspecified: Secondary | ICD-10-CM

## 2017-07-23 DIAGNOSIS — I700 Atherosclerosis of aorta: Secondary | ICD-10-CM | POA: Insufficient documentation

## 2017-07-23 DIAGNOSIS — I509 Heart failure, unspecified: Secondary | ICD-10-CM | POA: Diagnosis present

## 2017-07-23 DIAGNOSIS — I110 Hypertensive heart disease with heart failure: Secondary | ICD-10-CM | POA: Diagnosis present

## 2017-07-23 NOTE — Progress Notes (Signed)
*  PRELIMINARY RESULTS* Vascular Ultrasound Bilateral lower extremity venous duplex has been completed.  Preliminary findings: No evidence of deep vein thrombosis in the visualized veins or baker's cyst bilaterally.  Preliminary results called to Dr. Katherine Roan @ 13:35.  Everrett Coombe 07/23/2017, 1:24 PM

## 2018-10-07 ENCOUNTER — Other Ambulatory Visit (HOSPITAL_COMMUNITY): Payer: Self-pay | Admitting: Pulmonary Disease

## 2018-10-07 DIAGNOSIS — M7989 Other specified soft tissue disorders: Secondary | ICD-10-CM

## 2018-10-07 DIAGNOSIS — N183 Chronic kidney disease, stage 3 unspecified: Secondary | ICD-10-CM

## 2018-10-14 ENCOUNTER — Ambulatory Visit (HOSPITAL_COMMUNITY): Admission: RE | Admit: 2018-10-14 | Payer: Medicare HMO | Source: Ambulatory Visit

## 2018-10-14 ENCOUNTER — Ambulatory Visit (HOSPITAL_COMMUNITY): Payer: Medicare HMO

## 2018-10-14 ENCOUNTER — Other Ambulatory Visit (HOSPITAL_COMMUNITY): Payer: Self-pay | Admitting: Pulmonary Disease

## 2018-11-11 ENCOUNTER — Ambulatory Visit (HOSPITAL_COMMUNITY)
Admission: RE | Admit: 2018-11-11 | Discharge: 2018-11-11 | Disposition: A | Discharge status: Home / Self Care | Payer: Medicare HMO | Source: Ambulatory Visit | Attending: Pulmonary Disease | Admitting: Pulmonary Disease

## 2018-11-11 ENCOUNTER — Ambulatory Visit (HOSPITAL_COMMUNITY): Payer: Medicare HMO

## 2018-11-11 DIAGNOSIS — N183 Chronic kidney disease, stage 3 unspecified: Secondary | ICD-10-CM

## 2019-01-21 ENCOUNTER — Other Ambulatory Visit (HOSPITAL_COMMUNITY): Payer: Self-pay | Admitting: Urology

## 2019-01-21 DIAGNOSIS — C61 Malignant neoplasm of prostate: Secondary | ICD-10-CM

## 2019-01-31 ENCOUNTER — Other Ambulatory Visit: Payer: Self-pay

## 2019-01-31 ENCOUNTER — Encounter (HOSPITAL_COMMUNITY)
Admission: RE | Admit: 2019-01-31 | Discharge: 2019-01-31 | Disposition: A | Discharge status: Home / Self Care | Payer: Medicare HMO | Source: Ambulatory Visit | Attending: Urology | Admitting: Urology

## 2019-01-31 DIAGNOSIS — C61 Malignant neoplasm of prostate: Secondary | ICD-10-CM | POA: Diagnosis present

## 2019-01-31 MED ORDER — TECHNETIUM TC 99M MEDRONATE IV KIT
21.7000 | PACK | Freq: Once | INTRAVENOUS | Status: AC
  Administered 2019-01-31: 21.7 via INTRAVENOUS

## 2019-02-17 ENCOUNTER — Other Ambulatory Visit: Payer: Self-pay | Admitting: Urology

## 2019-02-17 DIAGNOSIS — C61 Malignant neoplasm of prostate: Secondary | ICD-10-CM

## 2019-03-06 ENCOUNTER — Other Ambulatory Visit: Payer: Self-pay

## 2019-03-06 ENCOUNTER — Ambulatory Visit
Admission: RE | Admit: 2019-03-06 | Discharge: 2019-03-06 | Disposition: A | Discharge status: Home / Self Care | Payer: Medicare HMO | Source: Ambulatory Visit | Attending: Urology | Admitting: Urology

## 2019-03-06 DIAGNOSIS — C61 Malignant neoplasm of prostate: Secondary | ICD-10-CM

## 2019-03-18 ENCOUNTER — Encounter: Payer: Self-pay | Admitting: Radiation Oncology

## 2019-03-18 ENCOUNTER — Other Ambulatory Visit: Payer: Self-pay

## 2019-03-18 ENCOUNTER — Ambulatory Visit
Admission: RE | Admit: 2019-03-18 | Discharge: 2019-03-18 | Disposition: A | Discharge status: Home / Self Care | Payer: Medicare HMO | Source: Ambulatory Visit | Attending: Radiation Oncology | Admitting: Radiation Oncology

## 2019-03-18 VITALS — Ht 71.5 in | Wt 235.0 lb

## 2019-03-18 DIAGNOSIS — C61 Malignant neoplasm of prostate: Secondary | ICD-10-CM

## 2019-03-18 DIAGNOSIS — M1990 Unspecified osteoarthritis, unspecified site: Secondary | ICD-10-CM | POA: Insufficient documentation

## 2019-03-18 HISTORY — DX: Malignant neoplasm of prostate: C61

## 2019-03-18 NOTE — Progress Notes (Signed)
See progress note under physician encounter. 

## 2019-03-18 NOTE — Progress Notes (Signed)
Radiation Oncology         (336) [phone] ________________________________  Initial Outpatient Consultation - Conducted via telephone due to current COVID-19 concerns for limiting patient exposure  Name: [name] MRN: [id number]  Date: 03/18/2019  DOB: 1941-02-15  FM:[id number], Iona Beard, MD  Vincente Liberty, MD   REFERRING PHYSICIAN: Vincente Liberty, MD  DIAGNOSIS: 78 y.o. gentleman with Stage T1c adenocarcinoma of the prostate with Gleason score of 4+3 and PSA of 19.10.    ICD-10-CM   1. Malignant neoplasm of prostate ([place]) C61     HISTORY OF PRESENT ILLNESS: [name] is a 78 y.o. male with a diagnosis of prostate cancer. He has a longstanding history of elevated PSA dating back to 2016, but no prior prostate biopsies. He was noted to have a significantly elevated PSA of 19.7 in December 2019 by his primary care physician, Dr. Vincente Liberty.  Accordingly, he was referred for evaluation in urology by Dr. Junious Silk on 11/27/2018, where a digital rectal examination was performed revealing a possible nodule in the apex but exam difficult due to patient body habitus.  A repeat PSA at that time was 19.10. The patient proceeded to transrectal ultrasound with 12 biopsies of the prostate on 01/14/2019.  The prostate volume measured 45.05 cc.  Out of 12 core biopsies, all 12 were positive.  The maximum Gleason score was 4+3, and this was seen in the left mid lateral, left base, left mid, and left apex. Gleason 3+4 was seen in 7 cores, and 3+3 was seen in 1 core.   Biopsies of prostate revealed:    Imaging studies for disease staging include CT A/P on 01/31/2019 which did not show any evidence of metastatic disease. Bone scan on 01/31/2019 identified nonspecific foci of increased tracer localization at left T3 and right T7 but follow-up imaging with a CT thoracic spine on 03/06/2019 confirmed degenerative changes only and no evidence of osseous metastasis.   The patient reviewed the biopsy  and imaging results with his urologist and he has kindly been referred today for discussion of potential radiation treatment options.  PREVIOUS RADIATION THERAPY: No  PAST MEDICAL HISTORY:  Past Medical History:  Diagnosis Date   Diabetes mellitus without complication ([place])    High cholesterol    Hypertension    Prostate cancer ([place])       PAST SURGICAL HISTORY: Past Surgical History:  Procedure Laterality Date   CORONARY ANGIOPLASTY WITH STENT PLACEMENT  2003?   PROSTATE BIOPSY      FAMILY HISTORY:  Family History  Problem Relation Age of Onset   Cancer Neg Hx     SOCIAL HISTORY:  Social History   Socioeconomic History   Marital status: Married    Spouse name: Not on file   Number of children: 2   Years of education: Not on file   Highest education level: Not on file  Occupational History    Comment: retired  Scientist, product/process development strain: Not on file   Food insecurity:    Worry: Not on file    Inability: Not on file   Transportation needs:    Medical: Not on file    Non-medical: Not on file  Tobacco Use   Smoking status: Former Smoker    Packs/day: 1.50    Years: 35.00    Pack years: 52.50    Types: Cigarettes    Last attempt to quit: 11/06/1993    Years since quitting: 25.3   Smokeless tobacco: Never  Used  Substance and Sexual Activity   Alcohol use: Not Currently    Comment: hasn't drank in 35-40 years   Drug use: Never   Sexual activity: Not on file  Lifestyle   Physical activity:    Days per week: Not on file    Minutes per session: Not on file   Stress: Not on file  Relationships   Social connections:    Talks on phone: Not on file    Gets together: Not on file    Attends religious service: Not on file    Active member of club or organization: Not on file    Attends meetings of clubs or organizations: Not on file    Relationship status: Not on file   Intimate partner violence:    Fear of current or ex  partner: Not on file    Emotionally abused: Not on file    Physically abused: Not on file    Forced sexual activity: Not on file  Other Topics Concern   Not on file  Social History Narrative   Not on file    ALLERGIES: Patient has no known allergies.  MEDICATIONS:  Current Outpatient Medications  Medication Sig Dispense Refill   amLODipine (NORVASC) 10 MG tablet Take 10 mg by mouth daily.     atorvastatin (LIPITOR) 80 MG tablet Take 80 mg by mouth daily.     benazepril (LOTENSIN) 40 MG tablet Take 40 mg by mouth daily.     furosemide (LASIX) 40 MG tablet Take 40 mg by mouth 2 (two) times daily.     ibuprofen (ADVIL) 800 MG tablet Take 800 mg by mouth 2 (two) times daily.     metFORMIN (GLUCOPHAGE) 1000 MG tablet      metoprolol succinate (TOPROL-XL) 100 MG 24 hr tablet TAKE 1 TABLET BY MOUTH EVERY DAY IN THE MORNING     pioglitazone (ACTOS) 15 MG tablet Take 15 mg by mouth daily.     tamsulosin (FLOMAX) 0.4 MG CAPS capsule TAKE 1 CAPSULE BY MOUTH EVERYDAY AT BEDTIME     Vitamin D, Ergocalciferol, (DRISDOL) 1.25 MG (50000 UT) CAPS capsule TAKE ONE CAPSULE BY MOUTH ONE TIME PER WEEK     No current facility-administered medications for this encounter.     REVIEW OF SYSTEMS:  On review of systems, the patient reports that he is doing well overall. He denies any chest pain, shortness of breath, cough, fevers, chills, night sweats, or unintended weight changes. He denies any bowel disturbances, and denies abdominal pain, nausea or vomiting. He denies any new musculoskeletal or joint aches or pains. His IPSS was 5, indicating mild urinary symptoms. He continues to take tamsulosin which has minimized his LUTS. He reports resolved dysuria and leakage. He denies hematuria. His SHIM was 1, indicating he does have severe erectile dysfunction. A complete review of systems is obtained and is otherwise negative.    PHYSICAL EXAM: Not performed in light of telephone consultation.  Wt  Readings from Last 3 Encounters:  03/18/19 235 lb (106.6 kg)  01/16/17 240 lb (108.9 kg)   Temp Readings from Last 3 Encounters:  01/16/17 98 F (36.7 C) (Oral)  05/11/15 97.9 F (36.6 C) (Oral)   BP Readings from Last 3 Encounters:  01/17/17 135/72  05/11/15 151/73   Pulse Readings from Last 3 Encounters:  01/17/17 65  05/11/15 74   Pain Assessment Pain Score: 0-No pain/10   LABORATORY DATA:  Lab Results  Component Value Date   WBC 5.1  01/16/2017   HGB 12.4 (L) 01/16/2017   HCT 39.1 01/16/2017   MCV 84.4 01/16/2017   PLT 263 01/16/2017   Lab Results  Component Value Date   NA 139 01/16/2017   K 3.3 (L) 01/16/2017   CL 102 01/16/2017   CO2 30 01/16/2017   Lab Results  Component Value Date   ALT 20 01/16/2017   AST 21 01/16/2017   ALKPHOS 107 01/16/2017   BILITOT 1.0 01/16/2017     RADIOGRAPHY: Ct Thoracic Spine Wo Contrast  Result Date: 03/06/2019 CLINICAL DATA:  Prostate cancer diagnosed 1 month ago. EXAM: CT THORACIC SPINE WITHOUT CONTRAST TECHNIQUE: Multidetector CT images of the thoracic were obtained using the standard protocol without intravenous contrast. COMPARISON:  Bone scan 01/31/2019 FINDINGS: Alignment: Normal. Vertebrae: No acute fracture or focal pathologic process. Paraspinal and other soft tissues: No acute paraspinal abnormality. Aortic atherosclerosis. Disc levels: Degenerative disc disease at T3-4, T4-5, T5-6, T6-7, T7-8 and T9-10. Right facet arthropathy at T2-3, T3-4, T4-5. Right facet arthropathy at T8-9. IMPRESSION: 1. No aggressive osseous lesion to suggest metastatic disease. 2. Mild thoracic spine spondylosis. 3.  Aortic Atherosclerosis (ICD10-I70.0). Electronically Signed   By: Kathreen Devoid   On: 03/06/2019 12:32      IMPRESSION/PLAN: This visit was conducted via telephone to spare the patient unnecessary potential exposure in the healthcare setting during the current COVID-19 pandemic. 1. 78 y.o. gentleman with Stage T1c adenocarcinoma  of the prostate with Gleason Score of 4+3 and PSA of 19.10. We discussed the patient's workup and outlined the nature of prostate cancer in this setting. The patient's Gleason's score and PSA put him into the unfavorable intermediate risk (UIR) group. Accordingly, he is eligible for a variety of potential treatment options including ST-ADT in combination with either 8 weeks of external radiation or brachytherapy boost followed by 5 weeks of external radiation. We discussed the available radiation techniques, and focused on the details and logistics of delivery. We discussed and outlined the risks, benefits, short and long-term effects associated with radiotherapy and compared and contrasted these with prostatectomy. We discussed the role of SpaceOAR in reducing the rectal toxicity associated with radiotherapy. We also detailed the role of ADT in the treatment of unfavorable-intermediate risk prostate cancer and outlined the associated side effects that could be expected with this therapy.  He was encouraged to ask questions that were answered to his stated satisfaction.  At the end of the conversation the patient is interested in moving forward with ST-ADT in combination with an upfront brachytherapy boost with SpaceOAR to reduce rectal toxicity from radiotherapy followed by a 5 week course of daily prostate IMRT to include the prostate and pelvic nodes. He has not received his first Lupron injection. We will share our discussion with Dr. Junious Silk and move forward with arranging start of ADT now and proceed with scheduling the brachytherapy boost procedure approximately two months thereafter.  Romie Jumper in our office will be working closely with him to coordinate OR scheduling and pre and post procedure appointments.  We will contact the pharmaceutical rep to ensure that Travis is available at the time of procedure.  He will have a prostate MRI following his post-seed CT SIM to confirm appropriate  distribution of the [place]. He will also undergo prostate CT SIM for planning of prostate IMRT to begin approximately 3 weeks after his brachytherapy boost procedure.  The patient appears to have a good understanding of his disease and our treatment recommendations which are of curative intent.  He is comfortable with and in agreement with the stated plan.  Given current concerns for patient exposure during the COVID-19 pandemic, this encounter was conducted via telephone. The patient was notified in advance and was offered a [place] meeting to allow for face to face communication but unfortunately reported that he did not have the appropriate resources/technology to support such a visit and instead preferred to proceed with telephone consult. The patient has given verbal consent for this type of encounter. The time spent during this encounter was 60 minutes with approximately 50% of that time spent in review of outside records and coordination of his care. The attendants for this meeting include Tyler Pita MD, Ashlyn Bruning PA-C, [place], and patient, [name]. During the encounter, Tyler Pita MD, Ashlyn Bruning PA-C, and scribe, Rae Lips were located at [facility] Radiation Oncology Department.  Patient, [name] was located at home.    Nicholos Johns, PA-C    Tyler Pita, MD  [facility] Direct Dial: [phone]   Fax: [phone] [email or web address]   Skype   [email or web address]  This document serves as a record of services personally performed by Tyler Pita, MD and Freeman Caldron, PA-C. It was created on their behalf by Rae Lips, a trained medical scribe. The creation of this record is based on the scribe's personal observations and the providers' statements to them. This document has been checked and approved by the attending providers.

## 2019-03-18 NOTE — Progress Notes (Signed)
GU Location of Tumor / Histology: prostatic adenocarcinoma  If Prostate Cancer, Gleason Score is (4 + 3) and PSA is (19.10). Prostate volume: 45.5 grams.   Biopsies of prostate (if applicable) revealed:    Past/Anticipated interventions by urology, if any: prostate biopsy, CT abd/pelvis (neg), bone scan (T spine concerning), MRI (negative), referral for consideration of radiation therapy  Past/Anticipated interventions by medical oncology, if any: no  Weight changes, if any: no  Bowel/Bladder complaints, if any: IPSS 5. SHIM 1. Dysuria resolved. Denies hematuria. Reports urinary leakage has resolved. Reports he continues to take Tamsulosin which has minimized his LUTS.    Nausea/Vomiting, if any: no  Pain issues, if any:  no  SAFETY ISSUES:  Prior radiation? no  Pacemaker/ICD? No pacemaker but does have a stent.  Possible current pregnancy? no, male patient  Is the patient on methotrexate? no  Current Complaints / other details:  78 year old male. Married with one son and one daughter.

## 2019-03-21 ENCOUNTER — Telehealth: Payer: Self-pay | Admitting: Medical Oncology

## 2019-03-21 NOTE — Telephone Encounter (Signed)
Left a message with Mr. Stille to introduce myself as the prostate nurse navigator and discuss my role. I was unable to meet him 5/12 due to consult was via telephone due to the COVID-19 restrictions. He did receive Lupron 02/21/19 with his urologist and will be scheduled for brachytherapy followed by 5 weeks of radiation. I asked him to call me with questions or concerns.

## 2019-04-01 ENCOUNTER — Other Ambulatory Visit: Payer: Self-pay | Admitting: Urology

## 2019-04-01 ENCOUNTER — Telehealth: Payer: Self-pay | Admitting: *Deleted

## 2019-04-01 NOTE — Telephone Encounter (Signed)
CALLED PATIENT TO INFORM OF PRE-SEED PLANNING CT FOR 04-08-19 AND HIS IMPLANT FOR 04-25-19, SPOKE WITH PATIENT AND HE IS AWARE OF THESE APPTS.

## 2019-04-02 ENCOUNTER — Other Ambulatory Visit: Payer: Self-pay | Admitting: Urology

## 2019-04-02 DIAGNOSIS — C61 Malignant neoplasm of prostate: Secondary | ICD-10-CM

## 2019-04-07 ENCOUNTER — Telehealth: Payer: Self-pay | Admitting: *Deleted

## 2019-04-07 NOTE — Telephone Encounter (Signed)
Called patient to remind of pre-seed planning CT and chest x-ray and EKG for 04-08-19, spoke with patient and he is aware of these appts.

## 2019-04-07 NOTE — Telephone Encounter (Signed)
Called patient to alter pre-seed appts. to 2 pm tomorrow spoke with patient and he is in agreeance.

## 2019-04-08 ENCOUNTER — Encounter (HOSPITAL_COMMUNITY): Payer: Medicare HMO

## 2019-04-08 ENCOUNTER — Other Ambulatory Visit: Payer: Self-pay

## 2019-04-08 ENCOUNTER — Ambulatory Visit
Admission: RE | Admit: 2019-04-08 | Discharge: 2019-04-08 | Disposition: A | Discharge status: Home / Self Care | Payer: Medicare HMO | Source: Ambulatory Visit | Attending: Radiation Oncology | Admitting: Radiation Oncology

## 2019-04-08 ENCOUNTER — Encounter (HOSPITAL_COMMUNITY)
Admission: RE | Admit: 2019-04-08 | Discharge: 2019-04-08 | Disposition: A | Discharge status: Home / Self Care | Payer: Medicare HMO | Source: Ambulatory Visit | Attending: Urology | Admitting: Urology

## 2019-04-08 ENCOUNTER — Ambulatory Visit
Admission: RE | Admit: 2019-04-08 | Discharge: 2019-04-08 | Disposition: A | Discharge status: Home / Self Care | Payer: Medicare HMO | Source: Ambulatory Visit | Attending: Urology | Admitting: Urology

## 2019-04-08 ENCOUNTER — Encounter: Payer: Self-pay | Admitting: Medical Oncology

## 2019-04-08 ENCOUNTER — Ambulatory Visit (HOSPITAL_COMMUNITY)
Admission: RE | Admit: 2019-04-08 | Discharge: 2019-04-08 | Disposition: A | Discharge status: Home / Self Care | Payer: Medicare HMO | Source: Ambulatory Visit | Attending: Urology | Admitting: Urology

## 2019-04-08 VITALS — BP 136/67 | HR 71 | Temp 98.3°F | Resp 20 | Ht 71.5 in | Wt 235.6 lb

## 2019-04-08 DIAGNOSIS — C61 Malignant neoplasm of prostate: Secondary | ICD-10-CM | POA: Insufficient documentation

## 2019-04-09 ENCOUNTER — Telehealth: Payer: Self-pay | Admitting: *Deleted

## 2019-04-09 NOTE — Telephone Encounter (Signed)
Called patient to inform of lab appt. for 04-22-19 - arrival time- 12:45 pm @ [facility] Admitting, lvm for a return call

## 2019-04-10 NOTE — Progress Notes (Signed)
  Radiation Oncology         (336) [phone] ________________________________  Name: ESTELL DILLINGER MRN: [id number]  Date: 04/08/2019  DOB: 1941/10/02  SIMULATION AND TREATMENT PLANNING NOTE PUBIC ARCH STUDY  [id number], Iona Beard, MD  Vincente Liberty, MD  DIAGNOSIS: 78 y.o. gentleman with Stage T1c adenocarcinoma of the prostate with Gleason score of 4+3 and PSA of 19.10.     ICD-10-CM   1. Malignant neoplasm of prostate ([place]) C61     COMPLEX SIMULATION:  The patient presented today for evaluation for possible prostate seed implant. He was brought to the radiation planning suite and placed supine on the CT couch. A 3-dimensional image study set was obtained in upload to the planning computer. There, on each axial slice, I contoured the prostate gland. Then, using three-dimensional radiation planning tools I reconstructed the prostate in view of the structures from the transperineal needle pathway to assess for possible pubic arch interference. In doing so, I did not appreciate any pubic arch interference. Also, the patient's prostate volume was estimated based on the drawn structure. The volume was 45 cc.  TRUS volume was 45.05 cc.  Given the pubic arch appearance and prostate volume, patient remains a good candidate to proceed with prostate seed implant. Today, he freely provided informed written consent to proceed.    PLAN: The patient will undergo prostate seed implant.   ________________________________  Sheral Apley. Tammi Klippel, M.D.

## 2019-04-17 ENCOUNTER — Telehealth: Payer: Self-pay | Admitting: *Deleted

## 2019-04-17 NOTE — Telephone Encounter (Signed)
Called patient to inform of lab on 04-22-19- arrival time - 12:45 pm @ [facility] Admitting, spoke with patient and he is aware of this appt.

## 2019-04-22 ENCOUNTER — Encounter (HOSPITAL_COMMUNITY)
Admission: RE | Admit: 2019-04-22 | Discharge: 2019-04-22 | Disposition: A | Discharge status: Home / Self Care | Payer: Medicare HMO | Source: Ambulatory Visit | Attending: Urology | Admitting: Urology

## 2019-04-22 ENCOUNTER — Other Ambulatory Visit (HOSPITAL_COMMUNITY)
Admission: RE | Admit: 2019-04-22 | Discharge: 2019-04-22 | Disposition: A | Discharge status: Home / Self Care | Payer: Medicare HMO | Source: Ambulatory Visit | Attending: Urology | Admitting: Urology

## 2019-04-22 ENCOUNTER — Other Ambulatory Visit: Payer: Self-pay

## 2019-04-22 DIAGNOSIS — Z7984 Long term (current) use of oral hypoglycemic drugs: Secondary | ICD-10-CM | POA: Diagnosis not present

## 2019-04-22 DIAGNOSIS — Z9842 Cataract extraction status, left eye: Secondary | ICD-10-CM | POA: Diagnosis not present

## 2019-04-22 DIAGNOSIS — Z87891 Personal history of nicotine dependence: Secondary | ICD-10-CM | POA: Diagnosis not present

## 2019-04-22 DIAGNOSIS — E1122 Type 2 diabetes mellitus with diabetic chronic kidney disease: Secondary | ICD-10-CM | POA: Diagnosis not present

## 2019-04-22 DIAGNOSIS — C61 Malignant neoplasm of prostate: Secondary | ICD-10-CM | POA: Diagnosis not present

## 2019-04-22 DIAGNOSIS — I129 Hypertensive chronic kidney disease with stage 1 through stage 4 chronic kidney disease, or unspecified chronic kidney disease: Secondary | ICD-10-CM | POA: Diagnosis not present

## 2019-04-22 DIAGNOSIS — N183 Chronic kidney disease, stage 3 (moderate): Secondary | ICD-10-CM | POA: Diagnosis not present

## 2019-04-22 DIAGNOSIS — Z9841 Cataract extraction status, right eye: Secondary | ICD-10-CM | POA: Diagnosis not present

## 2019-04-22 DIAGNOSIS — Z791 Long term (current) use of non-steroidal anti-inflammatories (NSAID): Secondary | ICD-10-CM | POA: Diagnosis not present

## 2019-04-22 DIAGNOSIS — Z79899 Other long term (current) drug therapy: Secondary | ICD-10-CM | POA: Diagnosis not present

## 2019-04-22 DIAGNOSIS — I2510 Atherosclerotic heart disease of native coronary artery without angina pectoris: Secondary | ICD-10-CM | POA: Diagnosis not present

## 2019-04-22 DIAGNOSIS — Z01812 Encounter for preprocedural laboratory examination: Secondary | ICD-10-CM | POA: Insufficient documentation

## 2019-04-22 DIAGNOSIS — Z1159 Encounter for screening for other viral diseases: Secondary | ICD-10-CM | POA: Insufficient documentation

## 2019-04-22 DIAGNOSIS — E785 Hyperlipidemia, unspecified: Secondary | ICD-10-CM | POA: Diagnosis not present

## 2019-04-22 DIAGNOSIS — Z955 Presence of coronary angioplasty implant and graft: Secondary | ICD-10-CM | POA: Diagnosis not present

## 2019-04-22 DIAGNOSIS — H9190 Unspecified hearing loss, unspecified ear: Secondary | ICD-10-CM | POA: Diagnosis not present

## 2019-04-22 DIAGNOSIS — N400 Enlarged prostate without lower urinary tract symptoms: Secondary | ICD-10-CM | POA: Diagnosis not present

## 2019-04-22 LAB — CBC
HCT: 36.4 % — ABNORMAL LOW (ref 39.0–52.0)
Hemoglobin: 11.2 g/dL — ABNORMAL LOW (ref 13.0–17.0)
MCH: 26.9 pg (ref 26.0–34.0)
MCHC: 30.8 g/dL (ref 30.0–36.0)
MCV: 87.3 fL (ref 80.0–100.0)
Platelets: 274 10*3/uL (ref 150–400)
RBC: 4.17 MIL/uL — ABNORMAL LOW (ref 4.22–5.81)
RDW: 16.4 % — ABNORMAL HIGH (ref 11.5–15.5)
WBC: 5.9 10*3/uL (ref 4.0–10.5)
nRBC: 0 % (ref 0.0–0.2)

## 2019-04-22 LAB — COMPREHENSIVE METABOLIC PANEL
ALT: 19 U/L (ref 0–44)
AST: 20 U/L (ref 15–41)
Albumin: 3.9 g/dL (ref 3.5–5.0)
Alkaline Phosphatase: 103 U/L (ref 38–126)
Anion gap: 14 (ref 5–15)
BUN: 20 mg/dL (ref 8–23)
CO2: 27 mmol/L (ref 22–32)
Calcium: 9.1 mg/dL (ref 8.9–10.3)
Chloride: 100 mmol/L (ref 98–111)
Creatinine, Ser: 1.46 mg/dL — ABNORMAL HIGH (ref 0.61–1.24)
GFR calc Af Amer: 53 mL/min — ABNORMAL LOW (ref >60)
GFR calc non Af Amer: 46 mL/min — ABNORMAL LOW (ref >60)
Glucose, Bld: 117 mg/dL — ABNORMAL HIGH (ref 70–99)
Potassium: 4.3 mmol/L (ref 3.5–5.1)
Sodium: 141 mmol/L (ref 135–145)
Total Bilirubin: 0.4 mg/dL (ref 0.3–1.2)
Total Protein: 8.6 g/dL — ABNORMAL HIGH (ref 6.5–8.1)

## 2019-04-22 LAB — PROTIME-INR
INR: 1 (ref 0.8–1.2)
Prothrombin Time: 12.8 seconds (ref 11.4–15.2)

## 2019-04-22 LAB — APTT: aPTT: 29 seconds (ref 24–36)

## 2019-04-23 LAB — NOVEL CORONAVIRUS, NAA (HOSP ORDER, SEND-OUT TO REF LAB; TAT 18-24 HRS): SARS-CoV-2, NAA: NOT DETECTED

## 2019-04-24 ENCOUNTER — Encounter: Payer: Self-pay | Admitting: *Deleted

## 2019-04-24 ENCOUNTER — Telehealth: Payer: Self-pay | Admitting: *Deleted

## 2019-04-24 ENCOUNTER — Other Ambulatory Visit: Payer: Self-pay

## 2019-04-24 ENCOUNTER — Encounter (HOSPITAL_BASED_OUTPATIENT_CLINIC_OR_DEPARTMENT_OTHER): Payer: Self-pay | Admitting: *Deleted

## 2019-04-24 NOTE — Telephone Encounter (Signed)
CALLED PATIENT TO REMIND OF IMPLANT FOR 04-25-19, LVM FOR A RETURN CALL

## 2019-04-24 NOTE — Progress Notes (Addendum)
Spoke w/ pt via phone for pre-op interview. Pt HOH.   Npo after mn.  Arrive at 1015.  Current lab work (cbc, cmp, pt/ptt, ekg, cxr) and covid test done , results in epic and chart.  Pt will take metoprolol, norvasc, and lipitor am dos w/ sips of water and do fleet enema.  Pt denies cardiac S&S, hx coronary stents 2003 and angioplasty for in-stent restenosis 2006.  Pt states has not seen cardiologist in over 10 yrs ago and is followed by his pcp.  Chart to anesthesia for review, Konrad Felix PA.  ADDENDUM:  Per Janett Billow ,  Pt ok to proceed.

## 2019-04-24 NOTE — Progress Notes (Signed)

## 2019-04-25 ENCOUNTER — Ambulatory Visit (HOSPITAL_BASED_OUTPATIENT_CLINIC_OR_DEPARTMENT_OTHER)
Admission: RE | Admit: 2019-04-25 | Discharge: 2019-04-25 | Disposition: A | Discharge status: Home / Self Care | Payer: Medicare HMO | Attending: Urology | Admitting: Urology

## 2019-04-25 ENCOUNTER — Ambulatory Visit (HOSPITAL_BASED_OUTPATIENT_CLINIC_OR_DEPARTMENT_OTHER): Payer: Medicare HMO | Admitting: Physician Assistant

## 2019-04-25 ENCOUNTER — Ambulatory Visit (HOSPITAL_COMMUNITY): Payer: Medicare HMO

## 2019-04-25 ENCOUNTER — Encounter (HOSPITAL_BASED_OUTPATIENT_CLINIC_OR_DEPARTMENT_OTHER): Payer: Self-pay | Admitting: Anesthesiology

## 2019-04-25 ENCOUNTER — Encounter: Payer: Self-pay | Admitting: Medical Oncology

## 2019-04-25 ENCOUNTER — Encounter (HOSPITAL_BASED_OUTPATIENT_CLINIC_OR_DEPARTMENT_OTHER)
Admission: RE | Disposition: A | Discharge status: Home / Self Care | Payer: Self-pay | Source: Home / Self Care | Attending: Urology

## 2019-04-25 ENCOUNTER — Other Ambulatory Visit: Payer: Self-pay

## 2019-04-25 DIAGNOSIS — Z79899 Other long term (current) drug therapy: Secondary | ICD-10-CM | POA: Insufficient documentation

## 2019-04-25 DIAGNOSIS — N183 Chronic kidney disease, stage 3 (moderate): Secondary | ICD-10-CM | POA: Insufficient documentation

## 2019-04-25 DIAGNOSIS — H9190 Unspecified hearing loss, unspecified ear: Secondary | ICD-10-CM | POA: Insufficient documentation

## 2019-04-25 DIAGNOSIS — Z87891 Personal history of nicotine dependence: Secondary | ICD-10-CM | POA: Insufficient documentation

## 2019-04-25 DIAGNOSIS — Z791 Long term (current) use of non-steroidal anti-inflammatories (NSAID): Secondary | ICD-10-CM | POA: Insufficient documentation

## 2019-04-25 DIAGNOSIS — E1122 Type 2 diabetes mellitus with diabetic chronic kidney disease: Secondary | ICD-10-CM | POA: Diagnosis not present

## 2019-04-25 DIAGNOSIS — E785 Hyperlipidemia, unspecified: Secondary | ICD-10-CM | POA: Insufficient documentation

## 2019-04-25 DIAGNOSIS — N400 Enlarged prostate without lower urinary tract symptoms: Secondary | ICD-10-CM | POA: Insufficient documentation

## 2019-04-25 DIAGNOSIS — Z9842 Cataract extraction status, left eye: Secondary | ICD-10-CM | POA: Insufficient documentation

## 2019-04-25 DIAGNOSIS — Z955 Presence of coronary angioplasty implant and graft: Secondary | ICD-10-CM | POA: Insufficient documentation

## 2019-04-25 DIAGNOSIS — I129 Hypertensive chronic kidney disease with stage 1 through stage 4 chronic kidney disease, or unspecified chronic kidney disease: Secondary | ICD-10-CM | POA: Diagnosis not present

## 2019-04-25 DIAGNOSIS — I2510 Atherosclerotic heart disease of native coronary artery without angina pectoris: Secondary | ICD-10-CM | POA: Insufficient documentation

## 2019-04-25 DIAGNOSIS — Z9841 Cataract extraction status, right eye: Secondary | ICD-10-CM | POA: Insufficient documentation

## 2019-04-25 DIAGNOSIS — Z1159 Encounter for screening for other viral diseases: Secondary | ICD-10-CM | POA: Insufficient documentation

## 2019-04-25 DIAGNOSIS — C61 Malignant neoplasm of prostate: Secondary | ICD-10-CM | POA: Insufficient documentation

## 2019-04-25 DIAGNOSIS — Z7984 Long term (current) use of oral hypoglycemic drugs: Secondary | ICD-10-CM | POA: Insufficient documentation

## 2019-04-25 HISTORY — DX: Benign prostatic hyperplasia with lower urinary tract symptoms: N40.1

## 2019-04-25 HISTORY — DX: Unspecified hearing loss, unspecified ear: H91.90

## 2019-04-25 HISTORY — PX: SPACE OAR INSTILLATION: SHX6769

## 2019-04-25 HISTORY — DX: Localized edema: R60.0

## 2019-04-25 HISTORY — DX: Type 2 diabetes mellitus without complications: E11.9

## 2019-04-25 HISTORY — PX: RADIOACTIVE SEED IMPLANT: SHX5150

## 2019-04-25 HISTORY — DX: Chronic kidney disease, stage 3 unspecified: N18.30

## 2019-04-25 HISTORY — DX: Hyperlipidemia, unspecified: E78.5

## 2019-04-25 HISTORY — DX: Xerosis cutis: L85.3

## 2019-04-25 HISTORY — DX: Rash and other nonspecific skin eruption: R21

## 2019-04-25 HISTORY — DX: Atherosclerotic heart disease of native coronary artery without angina pectoris: I25.10

## 2019-04-25 LAB — GLUCOSE, CAPILLARY
Glucose-Capillary: 134 mg/dL — ABNORMAL HIGH (ref 70–99)
Glucose-Capillary: 132 mg/dL — ABNORMAL HIGH (ref 70–99)

## 2019-04-25 SURGERY — INSERTION, RADIATION SOURCE, PROSTATE
Anesthesia: General

## 2019-04-25 MED ORDER — IOHEXOL 300 MG/ML  SOLN
INTRAMUSCULAR | Status: DC | PRN
  Administered 2019-04-25: 15:00:00 7 mL

## 2019-04-25 MED ORDER — ONDANSETRON HCL 4 MG/2ML IJ SOLN
INTRAMUSCULAR | Status: DC | PRN
  Administered 2019-04-25: 4 mg via INTRAVENOUS

## 2019-04-25 MED ORDER — ONDANSETRON HCL 4 MG/2ML IJ SOLN
INTRAMUSCULAR | Status: AC
  Filled 2019-04-25: qty 2

## 2019-04-25 MED ORDER — FENTANYL CITRATE (PF) 100 MCG/2ML IJ SOLN
INTRAMUSCULAR | Status: AC
  Filled 2019-04-25: qty 2

## 2019-04-25 MED ORDER — TAMSULOSIN HCL 0.4 MG PO CAPS
0.4000 mg | ORAL_CAPSULE | Freq: Once | ORAL | Status: AC
  Administered 2019-04-25: 0.4 mg via ORAL
  Filled 2019-04-25: qty 1

## 2019-04-25 MED ORDER — CIPROFLOXACIN IN D5W 400 MG/200ML IV SOLN
INTRAVENOUS | Status: AC
  Filled 2019-04-25: qty 200

## 2019-04-25 MED ORDER — SODIUM CHLORIDE 0.9 % IV SOLN
INTRAVENOUS | Status: DC
  Administered 2019-04-25 (×2): via INTRAVENOUS
  Filled 2019-04-25: qty 1000

## 2019-04-25 MED ORDER — TAMSULOSIN HCL 0.4 MG PO CAPS
ORAL_CAPSULE | ORAL | Status: AC
  Filled 2019-04-25: qty 1

## 2019-04-25 MED ORDER — FLEET ENEMA 7-19 GM/118ML RE ENEM
1.0000 | ENEMA | Freq: Once | RECTAL | Status: DC
  Filled 2019-04-25: qty 1

## 2019-04-25 MED ORDER — DOCUSATE SODIUM 100 MG PO CAPS: 100.0000 mg | ORAL_CAPSULE | Freq: Two times a day (BID) | ORAL | 0 refills | Status: DC

## 2019-04-25 MED ORDER — LIDOCAINE 2% (20 MG/ML) 5 ML SYRINGE
INTRAMUSCULAR | Status: AC
  Filled 2019-04-25: qty 5

## 2019-04-25 MED ORDER — HYDROCODONE-ACETAMINOPHEN 5-325 MG PO TABS: 1.0000 | ORAL_TABLET | ORAL | 0 refills | Status: DC | PRN

## 2019-04-25 MED ORDER — CEPHALEXIN 500 MG PO CAPS: 500.0000 mg | ORAL_CAPSULE | Freq: Three times a day (TID) | ORAL | 0 refills | Status: DC

## 2019-04-25 MED ORDER — SODIUM CHLORIDE (PF) 0.9 % IJ SOLN
INTRAMUSCULAR | Status: DC | PRN
  Administered 2019-04-25: 10 mL via INTRAVENOUS

## 2019-04-25 MED ORDER — DEXAMETHASONE SODIUM PHOSPHATE 10 MG/ML IJ SOLN
INTRAMUSCULAR | Status: DC | PRN
  Administered 2019-04-25: 4 mg via INTRAVENOUS

## 2019-04-25 MED ORDER — PHENYLEPHRINE 40 MCG/ML (10ML) SYRINGE FOR IV PUSH (FOR BLOOD PRESSURE SUPPORT)
PREFILLED_SYRINGE | INTRAVENOUS | Status: AC
  Filled 2019-04-25: qty 10

## 2019-04-25 MED ORDER — LIDOCAINE 2% (20 MG/ML) 5 ML SYRINGE
INTRAMUSCULAR | Status: DC | PRN
  Administered 2019-04-25: 60 mg via INTRAVENOUS

## 2019-04-25 MED ORDER — PROPOFOL 10 MG/ML IV BOLUS
INTRAVENOUS | Status: DC | PRN
  Administered 2019-04-25: 130 mg via INTRAVENOUS
  Administered 2019-04-25: 20 mg via INTRAVENOUS
  Administered 2019-04-25 (×2): 30 mg via INTRAVENOUS
  Administered 2019-04-25: 70 mg via INTRAVENOUS

## 2019-04-25 MED ORDER — PHENYLEPHRINE 40 MCG/ML (10ML) SYRINGE FOR IV PUSH (FOR BLOOD PRESSURE SUPPORT)
PREFILLED_SYRINGE | INTRAVENOUS | Status: DC | PRN
  Administered 2019-04-25: 80 ug via INTRAVENOUS
  Administered 2019-04-25: 120 ug via INTRAVENOUS
  Administered 2019-04-25: 80 ug via INTRAVENOUS

## 2019-04-25 MED ORDER — FENTANYL CITRATE (PF) 100 MCG/2ML IJ SOLN
INTRAMUSCULAR | Status: DC | PRN
  Administered 2019-04-25: 50 ug via INTRAVENOUS
  Administered 2019-04-25 (×2): 25 ug via INTRAVENOUS

## 2019-04-25 MED ORDER — PHENYLEPHRINE HCL (PRESSORS) 10 MG/ML IV SOLN
INTRAVENOUS | Status: AC
  Filled 2019-04-25: qty 2

## 2019-04-25 MED ORDER — DEXAMETHASONE SODIUM PHOSPHATE 10 MG/ML IJ SOLN
INTRAMUSCULAR | Status: AC
  Filled 2019-04-25: qty 1

## 2019-04-25 MED ORDER — ONDANSETRON HCL 4 MG/2ML IJ SOLN
4.0000 mg | Freq: Once | INTRAMUSCULAR | Status: DC | PRN
  Filled 2019-04-25: qty 2

## 2019-04-25 MED ORDER — FENTANYL CITRATE (PF) 100 MCG/2ML IJ SOLN
25.0000 ug | INTRAMUSCULAR | Status: DC | PRN
  Administered 2019-04-25: 25 ug via INTRAVENOUS
  Filled 2019-04-25: qty 1

## 2019-04-25 MED ORDER — OXYCODONE HCL 5 MG/5ML PO SOLN
5.0000 mg | Freq: Once | ORAL | Status: DC | PRN
  Filled 2019-04-25: qty 5

## 2019-04-25 MED ORDER — TAMSULOSIN HCL 0.4 MG PO CAPS: 0.4000 mg | ORAL_CAPSULE | Freq: Every day | ORAL | 0 refills | Status: DC

## 2019-04-25 MED ORDER — CIPROFLOXACIN IN D5W 400 MG/200ML IV SOLN
400.0000 mg | INTRAVENOUS | Status: AC
  Administered 2019-04-25: 400 mg via INTRAVENOUS
  Filled 2019-04-25: qty 200

## 2019-04-25 MED ORDER — PROPOFOL 10 MG/ML IV BOLUS
INTRAVENOUS | Status: AC
  Filled 2019-04-25: qty 20

## 2019-04-25 MED ORDER — OXYCODONE HCL 5 MG PO TABS
5.0000 mg | ORAL_TABLET | Freq: Once | ORAL | Status: DC | PRN
  Filled 2019-04-25: qty 1

## 2019-04-25 SURGICAL SUPPLY — 50 items
AGX100 I-SEED ×2 IMPLANT
BAG URINE DRAINAGE (UROLOGICAL SUPPLIES) ×3 IMPLANT
BLADE CLIPPER SENSICLIP SURGIC (BLADE) ×3 IMPLANT
CATH FOLEY 2WAY SLVR  5CC 16FR (CATHETERS) ×2
CATH FOLEY 2WAY SLVR 5CC 16FR (CATHETERS) ×1 IMPLANT
CATH ROBINSON RED A/P 16FR (CATHETERS) ×2 IMPLANT
CATH ROBINSON RED A/P 20FR (CATHETERS) ×3 IMPLANT
CLOTH BEACON ORANGE TIMEOUT ST (SAFETY) ×3 IMPLANT
CONT SPECI 4OZ STER CLIK (MISCELLANEOUS) ×6 IMPLANT
COVER BACK TABLE 60X90IN (DRAPES) ×3 IMPLANT
COVER MAYO STAND STRL (DRAPES) ×3 IMPLANT
COVER WAND RF STERILE (DRAPES) ×3 IMPLANT
DRSG TEGADERM 4X4.75 (GAUZE/BANDAGES/DRESSINGS) ×4 IMPLANT
DRSG TEGADERM 8X12 (GAUZE/BANDAGES/DRESSINGS) ×6 IMPLANT
GAUZE SPONGE 4X4 12PLY STRL (GAUZE/BANDAGES/DRESSINGS) ×2 IMPLANT
GLOVE BIO SURGEON STRL SZ 6 (GLOVE) IMPLANT
GLOVE BIO SURGEON STRL SZ 6.5 (GLOVE) ×4 IMPLANT
GLOVE BIO SURGEON STRL SZ7 (GLOVE) ×2 IMPLANT
GLOVE BIO SURGEON STRL SZ7.5 (GLOVE) ×10 IMPLANT
GLOVE BIO SURGEON STRL SZ8 (GLOVE) IMPLANT
GLOVE BIO SURGEONS STRL SZ 6.5 (GLOVE) ×4
GLOVE BIOGEL PI IND STRL 6 (GLOVE) IMPLANT
GLOVE BIOGEL PI IND STRL 6.5 (GLOVE) IMPLANT
GLOVE BIOGEL PI IND STRL 7.0 (GLOVE) IMPLANT
GLOVE BIOGEL PI IND STRL 8 (GLOVE) IMPLANT
GLOVE BIOGEL PI INDICATOR 6 (GLOVE)
GLOVE BIOGEL PI INDICATOR 6.5 (GLOVE)
GLOVE BIOGEL PI INDICATOR 7.0 (GLOVE)
GLOVE BIOGEL PI INDICATOR 8 (GLOVE)
GLOVE ECLIPSE 8.0 STRL XLNG CF (GLOVE) ×3 IMPLANT
GLOVE SURG SS PI 7.5 STRL IVOR (GLOVE) ×2 IMPLANT
GOWN STRL REUS W/ TWL LRG LVL3 (GOWN DISPOSABLE) ×1 IMPLANT
GOWN STRL REUS W/ TWL XL LVL3 (GOWN DISPOSABLE) ×1 IMPLANT
GOWN STRL REUS W/TWL LRG LVL3 (GOWN DISPOSABLE) ×6
GOWN STRL REUS W/TWL XL LVL3 (GOWN DISPOSABLE) ×6 IMPLANT
HOLDER FOLEY CATH W/STRAP (MISCELLANEOUS) ×3 IMPLANT
IMPL SPACEOAR SYSTEM 10ML (Spacer) ×1 IMPLANT
IMPLANT SPACEOAR SYSTEM 10ML (Spacer) ×3 IMPLANT
IV NS 1000ML (IV SOLUTION) ×3
IV NS 1000ML BAXH (IV SOLUTION) ×1 IMPLANT
KIT TURNOVER CYSTO (KITS) ×3 IMPLANT
MARKER SKIN DUAL TIP RULER LAB (MISCELLANEOUS) ×3 IMPLANT
PACK CYSTO (CUSTOM PROCEDURE TRAY) ×3 IMPLANT
SURGILUBE 2OZ TUBE FLIPTOP (MISCELLANEOUS) ×3 IMPLANT
SUT BONE WAX W31G (SUTURE) IMPLANT
SYR 10ML LL (SYRINGE) ×3 IMPLANT
TUBE CONNECTING 12'X1/4 (SUCTIONS)
TUBE CONNECTING 12X1/4 (SUCTIONS) IMPLANT
UNDERPAD 30X30 (UNDERPADS AND DIAPERS) ×6 IMPLANT
WATER STERILE IRR 500ML POUR (IV SOLUTION) ×3 IMPLANT

## 2019-04-25 NOTE — H&P (Signed)
H&P  Chief Complaint: Prostate cancer  History of Present Illness: [name] presents today for prostate brachytherapy permanent low-dose implant.  His prostate cancer was characterized by the following: Unfavorable intermediate risk prostate cancer  PSA 19.7  T1c  Prostate 45 g  Gleason 4+3 = 7 in 4 cores, 10-50%  Gleason 3+4 = 7 in 7 cores, 50-95%  Gleason 3+3 = 6 in 1 core, 60%  12 /12 cores positive  Staging: CT scan - no sign of metastatic disease or bone lesions. Possible distal common bile duct stone, check CMP and possible MRCP. Bone scan with increased tracer at T3 and T7 degenerative versus prostate cancer consider MRI.  Follow-up CT of the thoracic spine was negative.  He has noc x 3. Otherwise not too many voiding complaints. AUASS = 8.   He began treatment with androgen deprivation therapy given February 21, 2019.  He is done well.  He has no voiding complaints.  No dysuria or gross hematuria.  He has no fever or dysuria.    Past Medical History:  Diagnosis Date  . CKD (chronic kidney disease), stage III ([place])    followed by pcp--- renal ultrasound in epic 11-11-2018  . Coronary artery disease 04-24-2019 per pt followed by pcp,  has not seen cardiologist over 10 yrs ago and has not had a stress test since 2006/  denies cardiac S&S   hx cardiac cath w/ PCI with DES x2 to RCA 06-04-2002 and balloon angioplasty to in-stent restenosis RCA 03-08-2005  . Dry skin   . Edema of both lower extremities   . HOH (hard of hearing)   . Hyperlipidemia   . Hyperplasia of prostate with lower urinary tract symptoms (LUTS)   . Hypertension   . Prostate cancer [facility]) urologist-  dr Ora Bollig/  oncologist-  dr Tammi Klippel   dx 01-14-2019,  Stage T1c,  Gleason 4+3  . Rash    04-24-2019  pt reports has a rash on chest and left side of rib cage,  this is not new, he has had it for a while, does not itch  . S/P drug eluting coronary stent placement 06/04/2002   x2  DES to RCA  . Type 2 diabetes  mellitus ([place])    followed by pcp   Past Surgical History:  Procedure Laterality Date  . CARDIAC CATHETERIZATION  1997   cad  . CARDIAC CATHETERIZATION  12-08-2002    dr Einar Gip   previous stents are patent RCA/  luminal irregularites mLAD, pLAD, and Cfx  . CATARACT EXTRACTION W/ INTRAOCULAR LENS  IMPLANT, BILATERAL  2005  approx.  . CORONARY ANGIOPLASTY  03-08-2005    dr gamble   balloon angioplasty to in-stent restenosis@ junction between prox and distal stents in RCA/  mLAD and mCFx 50% stenosis/  ef 60-65%  . CORONARY ANGIOPLASTY WITH STENT PLACEMENT  06-04-2002   dr berry   PCI and DES x2 to mid and prox RCA overlapping ([place])/  mild cad mLAD and mCFx, nonobstructive;  LVEF 60%  . PROSTATE BIOPSY  01-14-2019   dr Rayquan Amrhein office    Home Medications:  Medications Prior to Admission  Medication Sig Dispense Refill Last Dose  . amLODipine (NORVASC) 10 MG tablet Take 10 mg by mouth daily.    04/24/2019 at Unknown time  . atorvastatin (LIPITOR) 80 MG tablet Take 80 mg by mouth daily.    04/25/2019 at 0630  . benazepril (LOTENSIN) 40 MG tablet Take 40 mg by mouth daily.  04/24/2019 at Unknown time  . furosemide (LASIX) 40 MG tablet Take 40 mg by mouth 2 (two) times daily.    04/24/2019 at Unknown time  . metFORMIN (GLUCOPHAGE) 1000 MG tablet Take 1,000 mg by mouth daily with breakfast.    04/24/2019 at Unknown time  . metoprolol succinate (TOPROL-XL) 100 MG 24 hr tablet Take 100 mg by mouth daily.    04/25/2019 at 0630  . pioglitazone (ACTOS) 15 MG tablet Take 15 mg by mouth daily.    04/25/2019 at 0630  . Vitamin D, Ergocalciferol, (DRISDOL) 1.25 MG (50000 UT) CAPS capsule Take 50,000 Units by mouth every 7 (seven) days.      Marland Kitchen ibuprofen (ADVIL) 800 MG tablet Take 800 mg by mouth daily.    04/21/2019   Allergies: No Known Allergies  Family History  Problem Relation Age of Onset  . Cancer Neg Hx    Social History:  reports that he quit smoking about 25 years ago. His smoking use included  cigarettes. He has a 52.50 pack-year smoking history. He has never used smokeless tobacco. He reports previous alcohol use. He reports that he does not use drugs.  ROS: A complete review of systems was performed.  All systems are negative except for pertinent findings as noted. Review of Systems  Cardiovascular: Positive for leg swelling.  All other systems reviewed and are negative.    Physical Exam:  Vital signs in last 24 hours: Temp:  [98.4 F (36.9 C)] 98.4 F (36.9 C) (06/19 1134) Pulse Rate:  [80] 80 (06/19 1134) Resp:  [18] 18 (06/19 1134) BP: (152)/(76) 152/76 (06/19 1134) SpO2:  [100 %] 100 % (06/19 1134) Weight:  [107.1 kg-110.2 kg] 107.1 kg (06/19 1134) General:  Alert and oriented, No acute distress HEENT: Normocephalic, atraumatic Cardiovascular: Regular rate and rhythm Lungs: Regular rate and effort Abdomen: Soft, nontender, nondistended, no abdominal masses Extremities: 3+ bilateral LE edema Neurologic: Grossly intact  Laboratory Data:  Results for orders placed or performed during the hospital encounter of 04/25/19 (from the past 24 hour(s))  Glucose, capillary     Status: Abnormal   Collection Time: 04/25/19 11:05 AM  Result Value Ref Range   Glucose-Capillary 134 (H) 70 - 99 mg/dL   Recent Results (from the past 240 hour(s))  Novel Coronavirus, NAA (hospital order; send-out to ref lab)     Status: None   Collection Time: 04/22/19  2:03 PM   Specimen: Nasopharyngeal Swab; Respiratory  Result Value Ref Range Status   SARS-CoV-2, NAA NOT DETECTED NOT DETECTED Final    Comment: (NOTE) This test was developed and its performance characteristics determined by [employer]. This test has not been FDA cleared or approved. This test has been authorized by FDA under an Emergency Use Authorization (EUA). This test is only authorized for the duration of time the declaration that circumstances exist justifying the authorization of the emergency use of in  vitro diagnostic tests for detection of SARS-CoV-2 virus and/or diagnosis of COVID-19 infection under section 564(b)(1) of the Act, 21 U.S.C. 825KNL-9(J)(6), unless the authorization is terminated or revoked sooner. When diagnostic testing is negative, the possibility of a false negative result should be considered in the context of a patient's recent exposures and the presence of clinical signs and symptoms consistent with COVID-19. An individual without symptoms of COVID-19 and who is not shedding SARS-CoV-2 virus would expect to have a negative (not detected) result in this assay. Performed  At: [facility] [place], [place]  [id number] Rush Farmer MD KK:[id number]    Coronavirus Source NASOPHARYNGEAL  Final    Comment: Performed at [facility], [place] [address], [place], [place] [address]   Creatinine: Recent Labs    04/22/19 1322  CREATININE 1.46*    Impression/Assessment:  Prostate cancer -   Plan:  I discussed with the patient the nature, potential benefits, risks and alternatives to Foley catheter placement, permanent low-dose prostate brachytherapy seed implant, spaceoar biodegradable gel insertion, flexible cystoscopy, including side effects of the proposed treatment, the likelihood of the patient achieving the goals of the procedure, and any potential problems that might occur during the procedure or recuperation. All questions answered. Patient elects to proceed.    [name] 04/25/2019, 11:58 AM

## 2019-04-25 NOTE — Anesthesia Preprocedure Evaluation (Addendum)
Anesthesia Evaluation  Patient identified by MRN, date of birth, ID band Patient awake    Reviewed: Allergy & Precautions, NPO status , Patient's Chart, lab work & pertinent test results  History of Anesthesia Complications Negative for: history of anesthetic complications  Airway Mallampati: III  TM Distance: >3 FB Neck ROM: Full    Dental  (+) Edentulous Upper, Edentulous Lower   Pulmonary neg pulmonary ROS, former smoker,    Pulmonary exam normal        Cardiovascular hypertension, + CAD and + Cardiac Stents (2003)  Normal cardiovascular exam     Neuro/Psych negative neurological ROS  negative psych ROS   GI/Hepatic negative GI ROS, Neg liver ROS,   Endo/Other  diabetes  Renal/GU Renal InsufficiencyRenal disease  negative genitourinary   Musculoskeletal negative musculoskeletal ROS (+)   Abdominal   Peds  Hematology negative hematology ROS (+)   Anesthesia Other Findings Cr 1.46, K 4.3, Hgb 11.2 Last PO crackers 0800  Reproductive/Obstetrics                          Anesthesia Physical Anesthesia Plan  ASA: III  Anesthesia Plan: General   Post-op Pain Management:    Induction: Intravenous  PONV Risk Score and Plan: 2 and Ondansetron, Dexamethasone and Treatment may vary due to age or medical condition  Airway Management Planned: LMA  Additional Equipment: None  Intra-op Plan:   Post-operative Plan: Extubation in OR  Informed Consent: I have reviewed the patients History and Physical, chart, labs and discussed the procedure including the risks, benefits and alternatives for the proposed anesthesia with the patient or authorized representative who has indicated his/her understanding and acceptance.     Dental advisory given  Plan Discussed with:   Anesthesia Plan Comments:        Anesthesia Quick Evaluation

## 2019-04-25 NOTE — Anesthesia Procedure Notes (Signed)
Procedure Name: LMA Insertion Date/Time: 04/25/2019 2:51 PM Performed by: Suzette Battiest, MD Pre-anesthesia Checklist: Patient identified, Emergency Drugs available, Suction available and Patient being monitored Patient Re-evaluated:Patient Re-evaluated prior to induction Oxygen Delivery Method: Circle system utilized Preoxygenation: Pre-oxygenation with 100% oxygen Induction Type: IV induction Ventilation: Mask ventilation without difficulty LMA: LMA inserted LMA Size: 5.0 Number of attempts: 1 Airway Equipment and Method: Bite block Placement Confirmation: positive ETCO2 Tube secured with: Tape Dental Injury: Teeth and Oropharynx as per pre-operative assessment

## 2019-04-25 NOTE — Transfer of Care (Signed)
Immediate Anesthesia Transfer of Care Note  Patient: [name]  Procedure(s) Performed: RADIOACTIVE SEED IMPLANT/BRACHYTHERAPY IMPLANT (N/A ) SPACE OAR INSTILLATION (N/A )  Patient Location: PACU  Anesthesia Type:General  Level of Consciousness: sedated and responds to stimulation  Airway & Oxygen Therapy: Patient Spontanous Breathing and Patient connected to nasal cannula oxygen  Post-op Assessment: Report given to RN, Post -op Vital signs reviewed and stable and Patient moving all extremities  Post vital signs: Reviewed and stable  Last Vitals:  Vitals Value Taken Time  BP    Temp    Pulse    Resp 12 04/25/19 1625  SpO2    Vitals shown include unvalidated device data.  Last Pain:  Vitals:   04/25/19 1134  TempSrc: Oral         Complications: No apparent anesthesia complications

## 2019-04-25 NOTE — Anesthesia Postprocedure Evaluation (Signed)
Anesthesia Post Note  Patient: [name]  Procedure(s) Performed: RADIOACTIVE SEED IMPLANT/BRACHYTHERAPY IMPLANT (N/A ) SPACE OAR INSTILLATION (N/A )     Patient location during evaluation: PACU Anesthesia Type: General Level of consciousness: awake and alert Pain management: pain level controlled Vital Signs Assessment: post-procedure vital signs reviewed and stable Respiratory status: spontaneous breathing, nonlabored ventilation, respiratory function stable and patient connected to nasal cannula oxygen Cardiovascular status: blood pressure returned to baseline and stable Postop Assessment: no apparent nausea or vomiting Anesthetic complications: no    Last Vitals:  Vitals:   04/25/19 1700 04/25/19 1806  BP: 123/65 137/85  Pulse: 62 65  Resp: 13 12  Temp:  36.8 C  SpO2: 92% 97%    Last Pain:  Vitals:   04/25/19 1806  TempSrc:   PainSc: 0-No pain                 Tiajuana Amass

## 2019-04-25 NOTE — Discharge Instructions (Signed)
Cystoscopy  Cystoscopy is a procedure that is used to help diagnose and sometimes treat conditions that affect that lower urinary tract. The lower urinary tract includes the bladder and the tube that drains urine from the bladder out of the body (urethra). Cystoscopy is performed with a thin, tube-shaped instrument with a light and camera at the end (cystoscope). The cystoscope may be hard (rigid) or flexible, depending on the goal of the procedure.The cystoscope is inserted through the urethra, into the bladder. Cystoscopy may be recommended if you have:  Urinary tractinfections that keep coming back (recurring).  Blood in the urine (hematuria).  Loss of bladder control (urinary incontinence) or an overactive bladder.  Unusual cells found in a urine sample.  A blockage in the urethra.  Painful urination.  An abnormality in the bladder found during an intravenous pyelogram (IVP) or CT scan. Cystoscopy may also be done to remove a sample of tissue to be examined under a microscope (biopsy). Tell a health care provider about:  Any allergies you have.  All medicines you are taking, including vitamins, herbs, eye drops, creams, and over-the-counter medicines.  Any problems you or family members have had with anesthetic medicines.  Any blood disorders you have.  Any surgeries you have had.  Any medical conditions you have.  Whether you are pregnant or may be pregnant. What are the risks? Generally, this is a safe procedure. However, problems may occur, including:  Infection.  Bleeding.  Allergic reactions to medicines.  Damage to other structures or organs. What happens before the procedure?  Ask your health care provider about: ? Changing or stopping your regular medicines. This is especially important if you are taking diabetes medicines or blood thinners. ? Taking medicines such as aspirin and ibuprofen. These medicines can thin your blood. Do not take these medicines  before your procedure if your health care provider instructs you not to.  Follow instructions from your health care provider about eating or drinking restrictions.  You may be given antibiotic medicine to help prevent infection.  You may have an exam or testing, such as X-rays of the bladder, urethra, or kidneys.  You may have urine tests to check for signs of infection.  Plan to have someone take you home after the procedure. What happens during the procedure?  To reduce your risk of infection,your health care team will wash or sanitize their hands.  You will be given one or more of the following: ? A medicine to help you relax (sedative). ? A medicine to numb the area (local anesthetic).  The area around the opening of your urethra will be cleaned.  The cystoscope will be passed through your urethra into your bladder.  Germ-free (sterile)fluid will flow through the cystoscope to fill your bladder. The fluid will stretch your bladder so that your surgeon can clearly examine your bladder walls.  The cystoscope will be removed and your bladder will be emptied. The procedure may vary among health care providers and hospitals. What happens after the procedure?  You may have some soreness or pain in your abdomen and urethra. Medicines will be available to help you.  You may have some blood in your urine.  Do not drive for 24 hours if you received a sedative. This information is not intended to replace advice given to you by your health care provider. Make sure you discuss any questions you have with your health care provider. Document Released: 10/20/2000 Document Revised: 08/03/2017 Document Reviewed: 09/09/2015 Elsevier Interactive Patient  Education  2019 [place] for Prostate Cancer, Care After  This sheet gives you information about how to care for yourself after your procedure. Your health care provider may also give you more specific instructions. If you  have problems or questions, contact your health care provider. What can I expect after the procedure? After the procedure, it is common to have:  Trouble passing urine.  Blood in the urine or semen.  Constipation.  Frequent feeling of an urgent need to urinate.  Bruising, swelling, and tenderness of the area behind the scrotum (perineum).  Bloating and gas.  Fatigue.  Burning or pain in the rectum.  Problems getting or keeping an erection (erectile dysfunction).  Nausea. Follow these instructions at home: Managing pain, stiffness, and swelling  If directed, apply ice to the affected area: ? Put ice in a plastic bag. ? Place a towel between your skin and the bag. ? Leave the ice on for 20 minutes, 2-3 times a day.  Try not to sit directly on the area behind the scrotum. A soft cushion can help with discomfort. Activity  Do not drive for 24 hours if you were given a medicine to help you relax (sedative).  Do not drive or use heavy machinery while taking prescription pain medicine.  Rest as told by your health care provider.  Most people can return to normal activities a few days or weeks after the procedure. Ask your health care provider what activities are safe for you. Eating and drinking  Drink enough fluid to keep your urine clear or pale yellow.  Eat a healthy, balanced diet. This includes lean proteins, whole grains, and plenty of fruits and vegetables. General instructions  Take over-the-counter and prescription medicines only as told by your health care provider.  Keep all follow-up visits as told by your health care provider. This is important. You may still need additional treatment.  Do not take baths, swim, or use a hot tub until your health care provider approves. Shower and wash the area behind the scrotum gently.  Do not have sex for one week after the treatment, or until your health care provider approves.  If you have permanent, low-dose  brachytherapy implants: ? Limit close contact with children and pregnant women for 2 months or as told by your health care provider. This is important because of the radiation that is still active in the prostate. ? You may set off radioactive sensors, such as airport screenings. Ask your health care provider for a document that explains your treatment. ? You may be instructed to use a condom during sex for the first 2 months after low-dose brachytherapy. Contact a health care provider if:  You have a fever or chills.  You do not have a bowel movement for 3-4 days after the procedure.  You have diarrhea for 3-4 days after the procedure.  You develop any new symptoms, such as problems with urinating or erectile dysfunction.  You have abdomen (abdominal) pain.  You have more blood in your urine. Get help right away if:  You cannot urinate.  There is excessive bleeding from your rectum.  You have unusual drainage coming from your rectum.  You have severe pain in the treated area that does not go away with pain medicine.  You have severe nausea or vomiting. Summary  If you have permanent, low-dose brachytherapy implants, limit close contact with children and pregnant women for 2 months or as told by your health care provider. This  is important because of the radiation that is still active in the prostate.  Talk with your health care provider about your risk of brachytherapy side effects, such as erectile dysfunction or urinary problems. Your health care provider will be able to recommend possible treatment options.  Keep all follow-up visits as told by your health care provider. This is important. You may need additional treatment. This information is not intended to replace advice given to you by your health care provider. Make sure you discuss any questions you have with your health care provider. Document Released: 11/25/2010 Document Revised: 11/24/2016 Document Reviewed:  11/24/2016 Elsevier Interactive Patient Education  2019 [place] Instructions   Activity:    Rest for the remainder of the day.  Do not drive or operate equipment today.  You may resume normal  activities in a few days as instructed by your physician, without risk of harmful radiation exposure to those around you, provided you follow the time and distance precautions on the Radiation Oncology Instruction Sheet.   Meals: Drink plenty of lipuids and eat light foods, such as gelatin or soup this evening .  You may return to normal meal plan tomorrow.  Return To Work: You may return to work as instructed by Naval [employer].  Special Instruction:   If any seeds are found, use tweezers to pick up seeds and place in a glass container of any kind and bring to your physician's office.  Call your physician if any of these symptoms occur:   Persistent or heavy bleeding  Urine stream diminishes or stops completely after catheter is removed  Fever equal to or greater than 101 degrees F  Cloudy urine with a strong foul odor  Severe pain  You may feel some burning pain and/or hesitancy when you urinate after the catheter is removed.  These symptoms may increase over the next few weeks, but should diminish within forur to six weeks.  Applying moist heat to the lower abdomen or a hot tub bath may help relieve the pain.  If the discomfort becomes severe, please call your physician for additional medications.   Post Anesthesia Home Care Instructions  Activity: Get plenty of rest for the remainder of the day. A responsible individual must stay with you for 24 hours following the procedure.  For the next 24 hours, DO NOT: -Drive a car -Paediatric nurse -Drink alcoholic beverages -Take any medication unless instructed by your physician -Make any legal decisions or sign important papers.  Meals: Start with liquid foods such as gelatin or soup.  Progress to regular foods as tolerated. Avoid greasy, spicy, heavy foods. If nausea and/or vomiting occur, drink only clear liquids until the nausea and/or vomiting subsides. Call your physician if vomiting continues.  Special Instructions/Symptoms: Your throat may feel dry or sore from the anesthesia or the breathing tube placed in your throat during surgery. If this causes discomfort, gargle with warm salt water. The discomfort should disappear within 24 hours.

## 2019-04-25 NOTE — Op Note (Addendum)
Preoperative diagnosis: Stage IIc (T1cNxMx, Grade group 3) Prostate cancer Postoperative diagnosis: Same  Procedure: Prostate brachytherapy seed implant, Cystoscopy, biodegradable gel insertion  Surgeon: Junious Silk  Radiation oncologist: Tammi Klippel  Anesthesia: Gen.  Indication for procedure: 78 year old with stage IIc prostate cancer who elected to proceed with prostate brachytherapy.  Findings: On fluoroscopic imaging there was adequate coverage of the prostate. On cystoscopy the urethra appeared normal, the prostatic urethra appeared normal, the trigone and ureteral orifices appeared normal with clear efflux. The bladder mucosa appeared normal. There were no stones, foreign bodies or seeds visualized in the bladder.  Dose: 110 Gy (prescribed)  Description of procedure: After consent was obtained patient brought to the operating room. After adequate anesthesia he is placed in lithotomy position and the transrectal ultrasound probe and perineal template positioned. Catheters and brachytherapy seeds were placed per Dr. Johny Shears plan. A total of 23 catheters and 67 active sources (I-125) were placed. The anchoring needles, template and ultrasound were removed.   The 18-gauge needle was then inserted approximately 1 to 2 cm anterior to the anal opening and directed under fluoroscopic guidance into the perirectal fat between the anterior rectal wall and the prostate capsule down to the mid-gland. Midline needle position was confirmed in the sagittal and axial views to verify the tip was in the perirectal fat.  Small amounts of saline were injected to hydrodissect the space between the prostate and the anterior rectal wall.  Axial imaging was viewed to confirm the needle was in the correct location in the mid gland and centered.  Aspiration confirmed no intravascular access.  The saline syringe was carefully disconnected maintaining the desired needle position and the hydrogel was attached to the  needle.  Under ultrasound guidance in the sagittal view a smooth continuous injection was done over about 12 seconds delivering the hydrogel into the space between the prostate and rectal wall.  The needle was withdrawn.  Scout flouro imaging was obtained of the implant. The Foley was removed. Another image was obtained. The patient was prepped again and cystoscopy was performed which was noted to be significant for a string protruding through the bladder neck left posterior just above the trigone.  The flexible cystoscope was withdrawn and the rigid cystoscope placed in a flexible grasper was used to grab the string and and pull it into the bladder.  It was then removed intact without difficulty.  There were 2 active sources in this strand. Leaving a total of 22 catheters and 65 active sources (I-125).  There was a small clot in the prostatic urethra and it was removed and the prostatic urethra was noted to be normal.  Also drained the bladder and refilled it to get a better look with the 70 degree lens and the bladder neck was otherwise noted to be normal.  The bladder was drained. He was awakened taken to the recovery room in stable condition.  Complications: None  Blood loss: Minimal  Specimens: None  Drains: none   Disposition: Patient stable to PACU.  I called and spoke to his daughter and went over the procedure. We also discussed the overall treatment - ADT --> seeds --> external beam.

## 2019-04-28 ENCOUNTER — Encounter (HOSPITAL_BASED_OUTPATIENT_CLINIC_OR_DEPARTMENT_OTHER): Payer: Self-pay | Admitting: Urology

## 2019-04-28 NOTE — Progress Notes (Signed)
  Radiation Oncology         (336) [phone] ________________________________  Name: [name] MRN: [id number]  Date: 04/28/2019  DOB: May 28, 1941       Prostate Seed Implant  [id number], Tracy Beard, MD  No ref. provider found  DIAGNOSIS: 78 y.o. 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 Malignant neoplasm of prostate ([place])  [place] Discharge patient  2. Prostate cancer ([place])  C61 DG Chest 2 View    DG Chest 2 View    PROCEDURE: Insertion of radioactive I-125 seeds into the prostate gland.  RADIATION DOSE: 110 Gy, boost therapy.  TECHNIQUE: [name] was brought to the operating room with the urologist. He was placed in the dorsolithotomy position. He was catheterized and a rectal tube was inserted. The perineum was shaved, prepped and draped. The ultrasound probe was then introduced into the rectum to see the prostate gland.  TREATMENT DEVICE: A needle grid was attached to the ultrasound probe stand and anchor needles were placed.  3D PLANNING: The prostate was imaged in 3D using a sagittal sweep of the prostate probe. These images were transferred to the planning computer. There, the prostate, urethra and rectum were defined on each axial reconstructed image. Then, the software created an optimized 3D plan and a few seed positions were adjusted. The quality of the plan was reviewed using [facility] information for the target and the following two organs at risk:  Urethra and Rectum.  Then the accepted plan was printed and handed off to the radiation therapist.  Under my supervision, the custom loading of the seeds and spacers was carried out and loaded into sealed vicryl sleeves.  These pre-loaded needles were then placed into the needle holder.Marland Kitchen  PROSTATE VOLUME STUDY:  Using transrectal ultrasound the volume of the prostate was verified to be 42.4 cc.  SPECIAL TREATMENT PROCEDURE/SUPERVISION AND HANDLING: The pre-loaded  needles were then delivered under sagittal guidance. A total of 22 needles were used to deposit 65 seeds in the prostate gland. The individual seed activity was 0.379 mCi.  SpaceOAR:  Yes  COMPLEX SIMULATION: At the end of the procedure, an anterior radiograph of the pelvis was obtained to document seed positioning and count. Cystoscopy was performed to check the urethra and bladder.  MICRODOSIMETRY: At the end of the procedure, the patient was emitting 0.097 mR/hr at 1 meter. Accordingly, he was considered safe for hospital discharge.  PLAN: The patient will return to the radiation oncology clinic for post implant CT dosimetry in three weeks.   ________________________________  Sheral Apley Tammi Klippel, M.D.

## 2019-05-07 ENCOUNTER — Telehealth: Payer: Self-pay | Admitting: *Deleted

## 2019-05-07 NOTE — Telephone Encounter (Signed)
Called patient to inform that MRI will be on 05-20-19 - arrival time- 8:30 am @ [facility], no restrictions to test, spoke with patient and he is aware of this test

## 2019-05-07 NOTE — Telephone Encounter (Signed)
RETURNED PATIENT'S PHONE CALL, SPOKE WITH PATIENT. ?

## 2019-05-07 NOTE — Telephone Encounter (Signed)
CALLED PATIENT TO INFORM OF POST SEED APPTS. AND MRI FOR 05-20-19, SPOKE WITH PATIENT AND HE IS AWARE OF THESE APPTS.

## 2019-05-08 ENCOUNTER — Telehealth: Payer: Self-pay | Admitting: *Deleted

## 2019-05-08 ENCOUNTER — Ambulatory Visit (HOSPITAL_COMMUNITY): Payer: Medicare HMO

## 2019-05-08 ENCOUNTER — Ambulatory Visit: Payer: Medicare HMO | Admitting: Urology

## 2019-05-08 ENCOUNTER — Ambulatory Visit: Payer: Medicare HMO | Admitting: Radiation Oncology

## 2019-05-08 NOTE — Telephone Encounter (Signed)
Called patient to inform that the PA, Ashlyn Bruning, states that his swollen lips are not radiation related, and that he should contact his PCP to be evaluated, or if his tongue or throat starts swelling he should go through the ER , patient verified understanding this

## 2019-05-19 ENCOUNTER — Telehealth: Payer: Self-pay | Admitting: *Deleted

## 2019-05-19 NOTE — Telephone Encounter (Signed)
CALLED PATIENT TO REMIND OF POST SEED APPTS. AND MRI FOR 05-20-19, SPOKE WITH PATIENT AND HE IS AWARE OF THESE APPTS.

## 2019-05-20 ENCOUNTER — Ambulatory Visit: Payer: Medicare HMO | Admitting: Radiation Oncology

## 2019-05-20 ENCOUNTER — Ambulatory Visit (HOSPITAL_COMMUNITY): Admission: RE | Admit: 2019-05-20 | Payer: Medicare HMO | Source: Ambulatory Visit

## 2019-05-20 ENCOUNTER — Ambulatory Visit (HOSPITAL_COMMUNITY): Payer: Medicare HMO

## 2019-05-20 ENCOUNTER — Ambulatory Visit
Admission: RE | Admit: 2019-05-20 | Discharge: 2019-05-20 | Disposition: A | Discharge status: Home / Self Care | Payer: Medicare HMO | Source: Ambulatory Visit | Attending: Radiation Oncology | Admitting: Radiation Oncology

## 2019-05-20 ENCOUNTER — Ambulatory Visit (HOSPITAL_COMMUNITY)
Admission: RE | Admit: 2019-05-20 | Discharge: 2019-05-20 | Disposition: A | Discharge status: Home / Self Care | Payer: Medicare HMO | Source: Ambulatory Visit | Attending: Urology | Admitting: Urology

## 2019-05-20 ENCOUNTER — Ambulatory Visit: Payer: Medicare HMO | Admitting: Urology

## 2019-05-20 ENCOUNTER — Other Ambulatory Visit: Payer: Self-pay

## 2019-05-20 DIAGNOSIS — C61 Malignant neoplasm of prostate: Secondary | ICD-10-CM | POA: Insufficient documentation

## 2019-05-23 NOTE — Progress Notes (Signed)
  Radiation Oncology         [phone]) [phone] ________________________________  Name: [name] MRN: [id number]  Date: 05/20/2019  DOB: [birth year]  SIMULATION AND TREATMENT PLANNING NOTE    ICD-10-CM   1. Malignant neoplasm of prostate ([place])  C61     DIAGNOSIS:  78 y.o. gentleman with Stage T1c adenocarcinoma of the prostate with Gleason score of 4+3 and PSA of 19.10.   NARRATIVE:  The patient was brought to the [facility].  Identity was confirmed.  All relevant records and images related to the planned course of therapy were reviewed.  The patient freely provided informed written consent to proceed with treatment after reviewing the details related to the planned course of therapy. The consent form was witnessed and verified by the simulation staff.  Then, the patient was set-up in a stable reproducible supine position for radiation therapy.  A vacuum lock pillow device was custom fabricated to position his legs in a reproducible immobilized position.  Then, I performed a urethrogram under sterile conditions to identify the prostatic apex.  CT images were obtained.  Surface markings were placed.  The CT images were loaded into the planning software.  Then the prostate target and avoidance structures including the rectum, bladder, bowel and hips were contoured.  Treatment planning then occurred.  The radiation prescription was entered and confirmed.  A total of one complex treatment devices were fabricated. I have requested : Intensity Modulated Radiotherapy (IMRT) is medically necessary for this case for the following reason:  Rectal sparing.Marland Kitchen  PLAN:  The patient will receive 45 Gy in 25 fractions of 1.8 Gy, to supplement an up-front prostate seed implant boost of 110 Gy to achieve a total nominal dose of 165 Gy.  ________________________________  Sheral Apley Tammi Klippel, M.D.

## 2019-05-26 ENCOUNTER — Encounter: Payer: Self-pay | Admitting: Radiation Oncology

## 2019-05-26 DIAGNOSIS — C61 Malignant neoplasm of prostate: Secondary | ICD-10-CM | POA: Diagnosis not present

## 2019-05-26 NOTE — Progress Notes (Signed)
  Radiation Oncology         [phone] ________________________________  Name: [name] MRN: [id number]  Date: 05/26/2019  DOB: 08-18-1941  3D Planning Note   Prostate Brachytherapy Post-Implant Dosimetry  Diagnosis: 78 y.o. gentleman with Stage T1c adenocarcinoma of the prostate with Gleason score of 4+3 and PSA of 19.10.   Narrative: On a previous date, [name] returned following prostate seed implantation for post implant planning. He underwent CT scan complex simulation to delineate the three-dimensional structures of the pelvis and demonstrate the radiation distribution.  Since that time, the seed localization, and complex isodose planning with dose volume histograms have now been completed.  Results:   Prostate Coverage - The dose of radiation delivered to the 90% or more of the prostate gland (D90) was 106.5% of the prescription dose. This exceeds our goal of greater than 90%. Rectal Sparing - The volume of rectal tissue receiving the prescription dose or higher was 0.0 cc. This falls under our thresholds tolerance of 1.0 cc.  Impression: The prostate seed implant appears to show adequate target coverage and appropriate rectal sparing.  Plan:  The patient will continue to follow with urology for ongoing PSA determinations. I would anticipate a high likelihood for local tumor control with minimal risk for rectal morbidity.  ________________________________  Sheral Apley Tammi Klippel, M.D.

## 2019-05-28 DIAGNOSIS — C61 Malignant neoplasm of prostate: Secondary | ICD-10-CM | POA: Diagnosis not present

## 2019-06-02 ENCOUNTER — Ambulatory Visit
Admission: RE | Admit: 2019-06-02 | Discharge: 2019-06-02 | Disposition: A | Discharge status: Home / Self Care | Payer: Medicare HMO | Source: Ambulatory Visit | Attending: Radiation Oncology | Admitting: Radiation Oncology

## 2019-06-02 ENCOUNTER — Other Ambulatory Visit: Payer: Self-pay

## 2019-06-02 DIAGNOSIS — C61 Malignant neoplasm of prostate: Secondary | ICD-10-CM | POA: Diagnosis not present

## 2019-06-03 ENCOUNTER — Other Ambulatory Visit: Payer: Self-pay

## 2019-06-03 ENCOUNTER — Ambulatory Visit
Admission: RE | Admit: 2019-06-03 | Discharge: 2019-06-03 | Disposition: A | Discharge status: Home / Self Care | Payer: Medicare HMO | Source: Ambulatory Visit | Attending: Radiation Oncology | Admitting: Radiation Oncology

## 2019-06-03 ENCOUNTER — Ambulatory Visit: Payer: Medicare HMO

## 2019-06-03 DIAGNOSIS — C61 Malignant neoplasm of prostate: Secondary | ICD-10-CM | POA: Diagnosis not present

## 2019-06-04 ENCOUNTER — Ambulatory Visit
Admission: RE | Admit: 2019-06-04 | Discharge: 2019-06-04 | Disposition: A | Discharge status: Home / Self Care | Payer: Medicare HMO | Source: Ambulatory Visit | Attending: Radiation Oncology | Admitting: Radiation Oncology

## 2019-06-04 ENCOUNTER — Other Ambulatory Visit: Payer: Self-pay

## 2019-06-04 DIAGNOSIS — C61 Malignant neoplasm of prostate: Secondary | ICD-10-CM | POA: Diagnosis not present

## 2019-06-05 ENCOUNTER — Ambulatory Visit
Admission: RE | Admit: 2019-06-05 | Discharge: 2019-06-05 | Disposition: A | Discharge status: Home / Self Care | Payer: Medicare HMO | Source: Ambulatory Visit | Attending: Radiation Oncology | Admitting: Radiation Oncology

## 2019-06-05 ENCOUNTER — Other Ambulatory Visit: Payer: Self-pay

## 2019-06-05 DIAGNOSIS — C61 Malignant neoplasm of prostate: Secondary | ICD-10-CM | POA: Diagnosis not present

## 2019-06-06 ENCOUNTER — Ambulatory Visit
Admission: RE | Admit: 2019-06-06 | Discharge: 2019-06-06 | Disposition: A | Discharge status: Home / Self Care | Payer: Medicare HMO | Source: Ambulatory Visit | Attending: Radiation Oncology | Admitting: Radiation Oncology

## 2019-06-06 ENCOUNTER — Other Ambulatory Visit: Payer: Self-pay

## 2019-06-06 DIAGNOSIS — C61 Malignant neoplasm of prostate: Secondary | ICD-10-CM | POA: Diagnosis not present

## 2019-06-08 ENCOUNTER — Ambulatory Visit: Payer: Medicare HMO

## 2019-06-09 ENCOUNTER — Ambulatory Visit
Admission: RE | Admit: 2019-06-09 | Discharge: 2019-06-09 | Disposition: A | Discharge status: Home / Self Care | Payer: Medicare HMO | Source: Ambulatory Visit | Attending: Radiation Oncology | Admitting: Radiation Oncology

## 2019-06-09 ENCOUNTER — Other Ambulatory Visit: Payer: Self-pay

## 2019-06-09 DIAGNOSIS — C61 Malignant neoplasm of prostate: Secondary | ICD-10-CM | POA: Insufficient documentation

## 2019-06-09 DIAGNOSIS — Z510 Encounter for antineoplastic radiation therapy: Secondary | ICD-10-CM | POA: Insufficient documentation

## 2019-06-10 ENCOUNTER — Other Ambulatory Visit: Payer: Self-pay

## 2019-06-10 ENCOUNTER — Ambulatory Visit
Admission: RE | Admit: 2019-06-10 | Discharge: 2019-06-10 | Disposition: A | Discharge status: Home / Self Care | Payer: Medicare HMO | Source: Ambulatory Visit | Attending: Radiation Oncology | Admitting: Radiation Oncology

## 2019-06-10 DIAGNOSIS — Z510 Encounter for antineoplastic radiation therapy: Secondary | ICD-10-CM | POA: Diagnosis not present

## 2019-06-11 ENCOUNTER — Other Ambulatory Visit: Payer: Self-pay

## 2019-06-11 ENCOUNTER — Ambulatory Visit
Admission: RE | Admit: 2019-06-11 | Discharge: 2019-06-11 | Disposition: A | Discharge status: Home / Self Care | Payer: Medicare HMO | Source: Ambulatory Visit | Attending: Radiation Oncology | Admitting: Radiation Oncology

## 2019-06-11 DIAGNOSIS — Z510 Encounter for antineoplastic radiation therapy: Secondary | ICD-10-CM | POA: Diagnosis not present

## 2019-06-12 ENCOUNTER — Other Ambulatory Visit: Payer: Self-pay

## 2019-06-12 ENCOUNTER — Ambulatory Visit
Admission: RE | Admit: 2019-06-12 | Discharge: 2019-06-12 | Disposition: A | Discharge status: Home / Self Care | Payer: Medicare HMO | Source: Ambulatory Visit | Attending: Radiation Oncology | Admitting: Radiation Oncology

## 2019-06-12 DIAGNOSIS — Z510 Encounter for antineoplastic radiation therapy: Secondary | ICD-10-CM | POA: Diagnosis not present

## 2019-06-13 ENCOUNTER — Ambulatory Visit
Admission: RE | Admit: 2019-06-13 | Discharge: 2019-06-13 | Disposition: A | Discharge status: Home / Self Care | Payer: Medicare HMO | Source: Ambulatory Visit | Attending: Radiation Oncology | Admitting: Radiation Oncology

## 2019-06-13 ENCOUNTER — Other Ambulatory Visit: Payer: Self-pay

## 2019-06-13 DIAGNOSIS — Z510 Encounter for antineoplastic radiation therapy: Secondary | ICD-10-CM | POA: Diagnosis not present

## 2019-06-16 ENCOUNTER — Ambulatory Visit
Admission: RE | Admit: 2019-06-16 | Discharge: 2019-06-16 | Disposition: A | Discharge status: Home / Self Care | Payer: Medicare HMO | Source: Ambulatory Visit | Attending: Radiation Oncology | Admitting: Radiation Oncology

## 2019-06-16 ENCOUNTER — Other Ambulatory Visit: Payer: Self-pay

## 2019-06-16 DIAGNOSIS — Z510 Encounter for antineoplastic radiation therapy: Secondary | ICD-10-CM | POA: Diagnosis not present

## 2019-06-17 ENCOUNTER — Other Ambulatory Visit: Payer: Self-pay

## 2019-06-17 ENCOUNTER — Ambulatory Visit
Admission: RE | Admit: 2019-06-17 | Discharge: 2019-06-17 | Disposition: A | Discharge status: Home / Self Care | Payer: Medicare HMO | Source: Ambulatory Visit | Attending: Radiation Oncology | Admitting: Radiation Oncology

## 2019-06-17 DIAGNOSIS — Z510 Encounter for antineoplastic radiation therapy: Secondary | ICD-10-CM | POA: Diagnosis not present

## 2019-06-18 ENCOUNTER — Ambulatory Visit
Admission: RE | Admit: 2019-06-18 | Discharge: 2019-06-18 | Disposition: A | Discharge status: Home / Self Care | Payer: Medicare HMO | Source: Ambulatory Visit | Attending: Radiation Oncology | Admitting: Radiation Oncology

## 2019-06-18 ENCOUNTER — Other Ambulatory Visit: Payer: Self-pay

## 2019-06-18 DIAGNOSIS — Z510 Encounter for antineoplastic radiation therapy: Secondary | ICD-10-CM | POA: Diagnosis not present

## 2019-06-19 ENCOUNTER — Ambulatory Visit
Admission: RE | Admit: 2019-06-19 | Discharge: 2019-06-19 | Disposition: A | Discharge status: Home / Self Care | Payer: Medicare HMO | Source: Ambulatory Visit | Attending: Radiation Oncology | Admitting: Radiation Oncology

## 2019-06-19 ENCOUNTER — Other Ambulatory Visit: Payer: Self-pay

## 2019-06-19 DIAGNOSIS — Z510 Encounter for antineoplastic radiation therapy: Secondary | ICD-10-CM | POA: Diagnosis not present

## 2019-06-20 ENCOUNTER — Other Ambulatory Visit: Payer: Self-pay

## 2019-06-20 ENCOUNTER — Ambulatory Visit
Admission: RE | Admit: 2019-06-20 | Discharge: 2019-06-20 | Disposition: A | Discharge status: Home / Self Care | Payer: Medicare HMO | Source: Ambulatory Visit | Attending: Radiation Oncology | Admitting: Radiation Oncology

## 2019-06-20 DIAGNOSIS — Z510 Encounter for antineoplastic radiation therapy: Secondary | ICD-10-CM | POA: Diagnosis not present

## 2019-06-23 ENCOUNTER — Other Ambulatory Visit: Payer: Self-pay

## 2019-06-23 ENCOUNTER — Ambulatory Visit
Admission: RE | Admit: 2019-06-23 | Discharge: 2019-06-23 | Disposition: A | Discharge status: Home / Self Care | Payer: Medicare HMO | Source: Ambulatory Visit | Attending: Radiation Oncology | Admitting: Radiation Oncology

## 2019-06-23 DIAGNOSIS — Z510 Encounter for antineoplastic radiation therapy: Secondary | ICD-10-CM | POA: Diagnosis not present

## 2019-06-24 ENCOUNTER — Other Ambulatory Visit: Payer: Self-pay

## 2019-06-24 ENCOUNTER — Ambulatory Visit
Admission: RE | Admit: 2019-06-24 | Discharge: 2019-06-24 | Disposition: A | Discharge status: Home / Self Care | Payer: Medicare HMO | Source: Ambulatory Visit | Attending: Radiation Oncology | Admitting: Radiation Oncology

## 2019-06-24 DIAGNOSIS — Z510 Encounter for antineoplastic radiation therapy: Secondary | ICD-10-CM | POA: Diagnosis not present

## 2019-06-25 ENCOUNTER — Other Ambulatory Visit: Payer: Self-pay

## 2019-06-25 ENCOUNTER — Ambulatory Visit
Admission: RE | Admit: 2019-06-25 | Discharge: 2019-06-25 | Disposition: A | Discharge status: Home / Self Care | Payer: Medicare HMO | Source: Ambulatory Visit | Attending: Radiation Oncology | Admitting: Radiation Oncology

## 2019-06-25 DIAGNOSIS — Z510 Encounter for antineoplastic radiation therapy: Secondary | ICD-10-CM | POA: Diagnosis not present

## 2019-06-26 ENCOUNTER — Ambulatory Visit
Admission: RE | Admit: 2019-06-26 | Discharge: 2019-06-26 | Disposition: A | Discharge status: Home / Self Care | Payer: Medicare HMO | Source: Ambulatory Visit | Attending: Radiation Oncology | Admitting: Radiation Oncology

## 2019-06-26 ENCOUNTER — Other Ambulatory Visit: Payer: Self-pay

## 2019-06-26 DIAGNOSIS — Z510 Encounter for antineoplastic radiation therapy: Secondary | ICD-10-CM | POA: Diagnosis not present

## 2019-06-27 ENCOUNTER — Other Ambulatory Visit: Payer: Self-pay

## 2019-06-27 ENCOUNTER — Ambulatory Visit
Admission: RE | Admit: 2019-06-27 | Discharge: 2019-06-27 | Disposition: A | Discharge status: Home / Self Care | Payer: Medicare HMO | Source: Ambulatory Visit | Attending: Radiation Oncology | Admitting: Radiation Oncology

## 2019-06-27 DIAGNOSIS — Z510 Encounter for antineoplastic radiation therapy: Secondary | ICD-10-CM | POA: Diagnosis not present

## 2019-06-30 ENCOUNTER — Other Ambulatory Visit: Payer: Self-pay

## 2019-06-30 ENCOUNTER — Ambulatory Visit
Admission: RE | Admit: 2019-06-30 | Discharge: 2019-06-30 | Disposition: A | Discharge status: Home / Self Care | Payer: Medicare HMO | Source: Ambulatory Visit | Attending: Radiation Oncology | Admitting: Radiation Oncology

## 2019-06-30 DIAGNOSIS — Z510 Encounter for antineoplastic radiation therapy: Secondary | ICD-10-CM | POA: Diagnosis not present

## 2019-07-01 ENCOUNTER — Other Ambulatory Visit: Payer: Self-pay

## 2019-07-01 ENCOUNTER — Ambulatory Visit
Admission: RE | Admit: 2019-07-01 | Discharge: 2019-07-01 | Disposition: A | Discharge status: Home / Self Care | Payer: Medicare HMO | Source: Ambulatory Visit | Attending: Radiation Oncology | Admitting: Radiation Oncology

## 2019-07-01 DIAGNOSIS — Z510 Encounter for antineoplastic radiation therapy: Secondary | ICD-10-CM | POA: Diagnosis not present

## 2019-07-02 ENCOUNTER — Ambulatory Visit
Admission: RE | Admit: 2019-07-02 | Discharge: 2019-07-02 | Disposition: A | Discharge status: Home / Self Care | Payer: Medicare HMO | Source: Ambulatory Visit | Attending: Radiation Oncology | Admitting: Radiation Oncology

## 2019-07-02 ENCOUNTER — Other Ambulatory Visit: Payer: Self-pay

## 2019-07-02 DIAGNOSIS — Z510 Encounter for antineoplastic radiation therapy: Secondary | ICD-10-CM | POA: Diagnosis not present

## 2019-07-03 ENCOUNTER — Ambulatory Visit
Admission: RE | Admit: 2019-07-03 | Discharge: 2019-07-03 | Disposition: A | Discharge status: Home / Self Care | Payer: Medicare HMO | Source: Ambulatory Visit | Attending: Radiation Oncology | Admitting: Radiation Oncology

## 2019-07-03 ENCOUNTER — Other Ambulatory Visit: Payer: Self-pay

## 2019-07-03 DIAGNOSIS — Z510 Encounter for antineoplastic radiation therapy: Secondary | ICD-10-CM | POA: Diagnosis not present

## 2019-07-04 ENCOUNTER — Other Ambulatory Visit: Payer: Self-pay

## 2019-07-04 ENCOUNTER — Ambulatory Visit
Admission: RE | Admit: 2019-07-04 | Discharge: 2019-07-04 | Disposition: A | Discharge status: Home / Self Care | Payer: Medicare HMO | Source: Ambulatory Visit | Attending: Radiation Oncology | Admitting: Radiation Oncology

## 2019-07-04 ENCOUNTER — Encounter: Payer: Self-pay | Admitting: Medical Oncology

## 2019-07-04 ENCOUNTER — Encounter: Payer: Self-pay | Admitting: Radiation Oncology

## 2019-07-04 ENCOUNTER — Ambulatory Visit: Payer: Medicare HMO

## 2019-07-04 DIAGNOSIS — Z510 Encounter for antineoplastic radiation therapy: Secondary | ICD-10-CM | POA: Diagnosis not present

## 2019-07-04 NOTE — Progress Notes (Signed)
Celebrated with Mr. Finigan as he completed radiation. He states he has done well but happy to be finished. He has follow up with Ashlyn 9/30.

## 2019-07-05 NOTE — Progress Notes (Signed)
  Radiation Oncology         [phone] ________________________________  Name: [name] MRN: [id number]  Date: 07/04/2019  DOB: [birth year]  End of Treatment Note  Diagnosis:   78 y.o. gentleman with Stage T1c adenocarcinoma of the prostate with Gleason score of 4+3 and PSA of 19.10.      Indication for treatment:  Curative, Definitive Radiotherapy       Radiation treatment dates:   Seed Implant on 04/25/19, then external radiation 7/27-8/28/20  Site/dose:  1. Radioactive seeds were implanted into the prostate for a total of 110 Gy. 2. The prostate, seminal vesicles, and pelvic lymph nodes were boosted with 45 Gy in 25 fractions of 1.8 Gy, for a total dose of 155 Gy  Beams/energy:  1. The radioactive seeds were delivered under sagittal guidance with 3D imaging. 2. The prostate, seminal vesicles, and pelvic lymph nodes were initially treated using helical intensity modulated radiotherapy delivering 6 megavolt photons. Image guidance was performed with megavoltage CT studies prior to each fraction. He was immobilized with a body fix lower extremity mold.   Narrative: The patient tolerated radiation treatment relatively well. The patient experienced some minor urinary irritation and modest fatigue.  Patient reported episodes of nocturia up to 7-8. Patient stated that he had some diarrhea. patient states that he took Imodium for relief. Patient stated that he feels he empties his bladder most of the time Patient reported that his stream is strong to moderate. Patient reported some pain in his penis. Patient reported some dysuria. Patient denied any hematuria. Patient denied any leakage. Patient reported some urgency  Plan: The patient has completed radiation treatment. He will return to radiation oncology clinic for routine followup in one month. I advised him to call or return sooner if he has any questions or concerns related to his recovery or treatment. ________________________________   Sheral Apley. Tammi Klippel, M.D.

## 2019-07-07 ENCOUNTER — Ambulatory Visit: Payer: Medicare HMO

## 2019-08-06 ENCOUNTER — Ambulatory Visit
Admission: RE | Admit: 2019-08-06 | Discharge: 2019-08-06 | Disposition: A | Discharge status: Home / Self Care | Payer: Medicare HMO | Source: Ambulatory Visit | Attending: Radiation Oncology | Admitting: Radiation Oncology

## 2019-08-06 ENCOUNTER — Other Ambulatory Visit: Payer: Self-pay

## 2019-08-06 ENCOUNTER — Other Ambulatory Visit: Payer: Self-pay | Admitting: Urology

## 2019-08-06 DIAGNOSIS — C61 Malignant neoplasm of prostate: Secondary | ICD-10-CM

## 2019-08-06 DIAGNOSIS — Z510 Encounter for antineoplastic radiation therapy: Secondary | ICD-10-CM | POA: Insufficient documentation

## 2019-08-06 MED ORDER — MIRABEGRON ER 50 MG PO TB24: 50.0000 mg | ORAL_TABLET | ORAL | 2 refills | Status: DC

## 2019-08-06 MED ORDER — TAMSULOSIN HCL 0.4 MG PO CAPS: 0.4000 mg | ORAL_CAPSULE | Freq: Every day | ORAL | 2 refills | Status: DC

## 2019-08-06 NOTE — Progress Notes (Signed)
Radiation Oncology         [phone]) [phone] ________________________________  Name: [name] MRN: [id number]  Date: 08/06/2019  DOB: 1941-01-17  Post Treatment Note  CC: Vincente Liberty, MD  Vincente Liberty, MD  Diagnosis:   78 y.o. gentleman with Stage T1c adenocarcinoma of the prostate with Gleason score of 4+3 and PSA of 19.10.     Interval Since Last Radiation:  4 weeks, concurrent with ADT. Seed Implant on 04/25/19, then external radiation 7/27-8/28/20: 1. Radioactive seeds were implanted into the prostate for a total of 110 Gy. 2. The prostate, seminal vesicles, and pelvic lymph nodes were boosted with 45 Gy in 25 fractions of 1.8 Gy, for a total dose of 155 Gy   Narrative:  I spoke with the patient to conduct his routine scheduled 1 month follow up visit via telephone to spare the patient unnecessary potential exposure in the healthcare setting during the current COVID-19 pandemic.  The patient was notified in advance and gave permission to proceed with this visit format. He tolerated radiation treatment relatively well with some minor urinary irritation and modest fatigue.  He reported episodes of nocturia up to 7-8x/night but maintained a strong flow of stream and was able to empty his bladder well on voiding.  He had occasional dysuria at the very start of his stream but denied gross hematuria, straining to void or incontinence.  He did experience some diarrhea.  Which was managed with Imodium as needed.   On review of systems, the patient states that he is doing well overall.  He has continued with significant LUTS with a weak stream, occasional dysuria, urgency, frequency and straining to start his stream at times.  He does not feel like he empties his bladder when voiding but denies gross hematuria, suprapubic discomfort or incontinence.  He has not had recent fever, chills or malodorous urine.  He continues with fatigue but is otherwise tolerating his ADT well.  He does have  decreased appetite which he attributes to a change in his taste buds since starting ADT.  He has continued taking Flomax daily and was previously taking Myrbetriq but ran out of this medication a few weeks ago.  He did feel like his symptoms were improved when taking the Myrbetriq.  ALLERGIES:  has No Known Allergies.  Meds: Current Outpatient Medications  Medication Sig Dispense Refill  . amLODipine (NORVASC) 10 MG tablet Take 10 mg by mouth daily.     Marland Kitchen atorvastatin (LIPITOR) 80 MG tablet Take 80 mg by mouth daily.     . benazepril (LOTENSIN) 40 MG tablet Take 40 mg by mouth daily.     Marland Kitchen docusate sodium (COLACE) 100 MG capsule Take 1 capsule (100 mg total) by mouth 2 (two) times daily. 10 capsule 0  . furosemide (LASIX) 40 MG tablet Take 40 mg by mouth 2 (two) times daily.     Marland Kitchen ibuprofen (ADVIL) 800 MG tablet Take 800 mg by mouth daily.     . metFORMIN (GLUCOPHAGE) 1000 MG tablet Take 1,000 mg by mouth daily with breakfast.     . metoprolol succinate (TOPROL-XL) 100 MG 24 hr tablet Take 100 mg by mouth daily.     . pioglitazone (ACTOS) 15 MG tablet Take 15 mg by mouth daily.     . Vitamin D, Ergocalciferol, (DRISDOL) 1.25 MG (50000 UT) CAPS capsule Take 50,000 Units by mouth every 7 (seven) days.     . mirabegron ER (MYRBETRIQ) 50 MG TB24 tablet Take 1  tablet (50 mg total) by mouth 1 day or 1 dose for 1 dose. 30 tablet 2  . tamsulosin (FLOMAX) 0.4 MG CAPS capsule Take 1 capsule (0.4 mg total) by mouth daily after supper. 30 capsule 2   No current facility-administered medications for this encounter.     Physical Findings:  vitals were not taken for this visit.   /Unable to assess due to telephone follow-up visit format.  Lab Findings: Lab Results  Component Value Date   WBC 5.9 04/22/2019   HGB 11.2 (L) 04/22/2019   HCT 36.4 (L) 04/22/2019   MCV 87.3 04/22/2019   PLT 274 04/22/2019     Radiographic Findings: No results found.  Impression/Plan: 1. 78 y.o. gentleman with  Stage T1c adenocarcinoma of the prostate with Gleason score of 4+3 and PSA of 19.10.    He will continue to follow up with urology for ongoing PSA determinations but does not currently have a follow-up appointment scheduled with Dr. Junious Silk to his knowledge.  I have refilled his Myrbetriq and he will continue taking Flomax daily until follow-up with urology.  He understands what to expect with regards to PSA monitoring going forward. I will look forward to following his response to treatment via correspondence with urology, and would be happy to continue to participate in his care if clinically indicated. I talked to the patient about what to expect in the future, including his risk for erectile dysfunction and rectal bleeding. I encouraged him to call or return to the office if he has any questions regarding his previous radiation or possible radiation side effects. He was comfortable with this plan and will follow up as needed.    Nicholos Johns, PA-C

## 2020-01-02 ENCOUNTER — Ambulatory Visit: Payer: Medicare HMO | Attending: Internal Medicine

## 2020-01-02 DIAGNOSIS — Z23 Encounter for immunization: Secondary | ICD-10-CM | POA: Insufficient documentation

## 2020-01-02 NOTE — Progress Notes (Signed)
   Covid-19 Vaccination Clinic  Name:  [name]    MRN: [id number] DOB: 1941/03/27  01/02/2020  [name] was observed post Covid-19 immunization for 15 minutes without incidence. He was provided with Vaccine Information Sheet and instruction to access the V-Safe system.   [name] was instructed to call 911 with any severe reactions post vaccine: Marland Kitchen Difficulty breathing  . Swelling of your face and throat  . A fast heartbeat  . A bad rash all over your body  . Dizziness and weakness    Immunizations Administered    Name Date Dose VIS Date Route   Pfizer COVID-19 Vaccine 01/02/2020 11:34 AM 0.3 mL 10/17/2019 Intramuscular   Manufacturer: [place]   Lot: [id number]   [place]: [id number]

## 2020-01-27 ENCOUNTER — Ambulatory Visit: Payer: Medicare HMO | Attending: Internal Medicine

## 2020-01-27 DIAGNOSIS — Z23 Encounter for immunization: Secondary | ICD-10-CM

## 2020-01-27 NOTE — Progress Notes (Signed)
   Covid-19 Vaccination Clinic  Name:  [name]    MRN: [id number] DOB: 17-Feb-1941  01/27/2020  [name] was observed post Covid-19 immunization for 15 minutes without incident. He was provided with Vaccine Information Sheet and instruction to access the V-Safe system.   [name] was instructed to call 911 with any severe reactions post vaccine: Marland Kitchen Difficulty breathing  . Swelling of face and throat  . A fast heartbeat  . A bad rash all over body  . Dizziness and weakness   Immunizations Administered    Name Date Dose VIS Date Route   Pfizer COVID-19 Vaccine 01/27/2020  2:11 PM 0.3 mL 10/17/2019 Intramuscular   Manufacturer: [place]   Lot: [id number]   [place]: [id number]

## 2020-03-29 ENCOUNTER — Telehealth: Payer: Self-pay | Admitting: Radiation Oncology

## 2020-03-29 NOTE — Telephone Encounter (Signed)
Received refill request fax for patient's tamsulosin. Faxed back denial and encouraged patient seek refill from urologist. Patient hasn't been seen in this office since September 2020. Fax confirmation of delivery obtained.

## 2022-03-28 ENCOUNTER — Emergency Department (HOSPITAL_BASED_OUTPATIENT_CLINIC_OR_DEPARTMENT_OTHER): Payer: Medicare HMO

## 2022-03-28 ENCOUNTER — Emergency Department (HOSPITAL_BASED_OUTPATIENT_CLINIC_OR_DEPARTMENT_OTHER)
Admission: EM | Admit: 2022-03-28 | Discharge: 2022-03-29 | Disposition: A | Discharge status: Home / Self Care | Payer: Medicare HMO | Attending: Emergency Medicine | Admitting: Emergency Medicine

## 2022-03-28 ENCOUNTER — Encounter (HOSPITAL_BASED_OUTPATIENT_CLINIC_OR_DEPARTMENT_OTHER): Payer: Self-pay

## 2022-03-28 ENCOUNTER — Other Ambulatory Visit: Payer: Self-pay

## 2022-03-28 DIAGNOSIS — R42 Dizziness and giddiness: Secondary | ICD-10-CM | POA: Diagnosis present

## 2022-03-28 DIAGNOSIS — E119 Type 2 diabetes mellitus without complications: Secondary | ICD-10-CM | POA: Insufficient documentation

## 2022-03-28 DIAGNOSIS — I10 Essential (primary) hypertension: Secondary | ICD-10-CM | POA: Insufficient documentation

## 2022-03-28 DIAGNOSIS — Z7984 Long term (current) use of oral hypoglycemic drugs: Secondary | ICD-10-CM | POA: Insufficient documentation

## 2022-03-28 DIAGNOSIS — Z79899 Other long term (current) drug therapy: Secondary | ICD-10-CM | POA: Insufficient documentation

## 2022-03-28 DIAGNOSIS — R531 Weakness: Secondary | ICD-10-CM | POA: Insufficient documentation

## 2022-03-28 LAB — URINALYSIS, ROUTINE W REFLEX MICROSCOPIC
Bilirubin Urine: NEGATIVE
Glucose, UA: NEGATIVE mg/dL
Hgb urine dipstick: NEGATIVE
Ketones, ur: NEGATIVE mg/dL
Leukocytes,Ua: NEGATIVE
Nitrite: NEGATIVE
Protein, ur: NEGATIVE mg/dL
Specific Gravity, Urine: 1.015 (ref 1.005–1.030)
pH: 6 (ref 5.0–8.0)

## 2022-03-28 LAB — CBC WITH DIFFERENTIAL/PLATELET
Abs Immature Granulocytes: 0.02 10*3/uL (ref 0.00–0.07)
Basophils Absolute: 0 10*3/uL (ref 0.0–0.1)
Basophils Relative: 1 %
Eosinophils Absolute: 0.1 10*3/uL (ref 0.0–0.5)
Eosinophils Relative: 3 %
HCT: 41 % (ref 39.0–52.0)
Hemoglobin: 13.2 g/dL (ref 13.0–17.0)
Immature Granulocytes: 0 %
Lymphocytes Relative: 25 %
Lymphs Abs: 1.3 10*3/uL (ref 0.7–4.0)
MCH: 27.6 pg (ref 26.0–34.0)
MCHC: 32.2 g/dL (ref 30.0–36.0)
MCV: 85.6 fL (ref 80.0–100.0)
Monocytes Absolute: 0.5 10*3/uL (ref 0.1–1.0)
Monocytes Relative: 9 %
Neutro Abs: 3.1 10*3/uL (ref 1.7–7.7)
Neutrophils Relative %: 62 %
Platelets: 237 10*3/uL (ref 150–400)
RBC: 4.79 MIL/uL (ref 4.22–5.81)
RDW: 15.6 % — ABNORMAL HIGH (ref 11.5–15.5)
WBC: 5 10*3/uL (ref 4.0–10.5)
nRBC: 0 % (ref 0.0–0.2)

## 2022-03-28 LAB — COMPREHENSIVE METABOLIC PANEL
ALT: 22 U/L (ref 0–44)
AST: 21 U/L (ref 15–41)
Albumin: 3.9 g/dL (ref 3.5–5.0)
Alkaline Phosphatase: 101 U/L (ref 38–126)
Anion gap: 6 (ref 5–15)
BUN: 18 mg/dL (ref 8–23)
CO2: 29 mmol/L (ref 22–32)
Calcium: 8.9 mg/dL (ref 8.9–10.3)
Chloride: 103 mmol/L (ref 98–111)
Creatinine, Ser: 1.62 mg/dL — ABNORMAL HIGH (ref 0.61–1.24)
GFR, Estimated: 43 mL/min — ABNORMAL LOW (ref >60)
Glucose, Bld: 115 mg/dL — ABNORMAL HIGH (ref 70–99)
Potassium: 3.7 mmol/L (ref 3.5–5.1)
Sodium: 138 mmol/L (ref 135–145)
Total Bilirubin: 1.2 mg/dL (ref 0.3–1.2)
Total Protein: 8.3 g/dL — ABNORMAL HIGH (ref 6.5–8.1)

## 2022-03-28 MED ORDER — MECLIZINE HCL 25 MG PO TABS
25.0000 mg | ORAL_TABLET | Freq: Once | ORAL | Status: AC
  Administered 2022-03-28: 25 mg via ORAL
  Filled 2022-03-28: qty 1

## 2022-03-28 MED ORDER — SODIUM CHLORIDE 0.9 % IV SOLN: INTRAVENOUS | Status: DC

## 2022-03-28 MED ORDER — SODIUM CHLORIDE 0.9 % IV BOLUS
500.0000 mL | Freq: Once | INTRAVENOUS | Status: AC
Start: 2022-03-28 — End: 2022-03-29
  Administered 2022-03-28: 500 mL via INTRAVENOUS

## 2022-03-28 NOTE — ED Triage Notes (Signed)
Pt presents to ED from home C/O generalized weakness and dizziness. Per daughter, pt has fallen several times throughout the last month. Pt was sent from [facility].

## 2022-03-28 NOTE — ED Provider Notes (Signed)
[facility] EMERGENCY DEPARTMENT Provider Note   CSN: [id number] Arrival date & time: 03/28/22  2015     History  Chief Complaint  Patient presents with   Weakness   Dizziness    [name] is a 81 y.o. male.  Patient with a complaint of some dizziness over about a week.  Does have some room spinning at times.  Comes and goes.  Worse if he moves his head.  Has made him lose his balance and he has fallen a few times.  Denies any pain anywhere.  Chart review shows that social services has been involved with his care starting back in March of this year.  Patient denies any chest pain.  No fevers here temp 98.1 blood pressure 154/70 heart rate 66 and respirations 20.  Past medical history significant for diabetes hypertension hyperlipidemia coronary disease with stents chronic kidney disease stage III.  Patient also has a history of prostate cancer.  Had radioactive seed implant in 2020.      Home Medications Prior to Admission medications   Medication Sig Start Date End Date Taking? Authorizing Provider  meclizine (ANTIVERT) 25 MG tablet Take 1 tablet (25 mg total) by mouth 3 (three) times daily as needed for dizziness. 03/29/22  Yes Fredia Sorrow, MD  amLODipine (NORVASC) 10 MG tablet Take 10 mg by mouth daily.  02/20/19   [provider]  atorvastatin (LIPITOR) 80 MG tablet Take 80 mg by mouth daily.  01/02/19   [provider]  benazepril (LOTENSIN) 40 MG tablet Take 40 mg by mouth daily.  01/02/19   [provider]  docusate sodium (COLACE) 100 MG capsule Take 1 capsule (100 mg total) by mouth 2 (two) times daily. 04/25/19   Festus Aloe, MD  furosemide (LASIX) 40 MG tablet Take 40 mg by mouth 2 (two) times daily.  12/28/18   [provider]  ibuprofen (ADVIL) 800 MG tablet Take 800 mg by mouth daily.  01/02/19   [provider]  metFORMIN (GLUCOPHAGE) 1000 MG tablet Take 1,000 mg by mouth daily with breakfast.  10/03/18    [provider]  metoprolol succinate (TOPROL-XL) 100 MG 24 hr tablet Take 100 mg by mouth daily.  01/30/19   [provider]  mirabegron ER (MYRBETRIQ) 50 MG TB24 tablet Take 1 tablet (50 mg total) by mouth 1 day or 1 dose for 1 dose. 08/06/19 08/07/19  Bruning, Ashlyn, PA-C  pioglitazone (ACTOS) 15 MG tablet Take 15 mg by mouth daily.  01/27/19   [provider]  tamsulosin (FLOMAX) 0.4 MG CAPS capsule Take 1 capsule (0.4 mg total) by mouth daily after supper. 08/06/19   Bruning, Ashlyn, PA-C  Vitamin D, Ergocalciferol, (DRISDOL) 1.25 MG (50000 UT) CAPS capsule Take 50,000 Units by mouth every 7 (seven) days.  02/20/19   [provider]      Allergies    Patient has no known allergies.    Review of Systems   Review of Systems  Constitutional:  Negative for chills and fever.  HENT:  Negative for ear pain and sore throat.   Eyes:  Negative for pain and visual disturbance.  Respiratory:  Negative for cough and shortness of breath.   Cardiovascular:  Negative for chest pain and palpitations.  Gastrointestinal:  Negative for abdominal pain, nausea and vomiting.  Genitourinary:  Negative for dysuria and hematuria.  Musculoskeletal:  Negative for arthralgias and back pain.  Skin:  Negative for color change and rash.  Neurological:  Positive for dizziness. Negative for seizures, syncope, facial asymmetry, speech difficulty, weakness, numbness and headaches.  All other systems reviewed and are negative.  Physical Exam Updated Vital Signs BP 137/69   Pulse 73   Temp 98.1 F (36.7 C) (Oral)   Resp (!) 25   Ht 1.816 m (5' 11.5")   Wt 108.9 kg   SpO2 98%   BMI 33.01 kg/m  Physical Exam Vitals and nursing note reviewed.  Constitutional:      General: He is not in acute distress.    Appearance: Normal appearance. He is well-developed.  HENT:     Head: Normocephalic and atraumatic.     Mouth/Throat:     Mouth: Mucous membranes are dry.  Eyes:      Extraocular Movements: Extraocular movements intact.     Conjunctiva/sclera: Conjunctivae normal.     Pupils: Pupils are equal, round, and reactive to light.  Neck:     Comments: Dizziness can be elicited by moving the chin to the left shoulder right shoulder.  At rest no dizziness. Cardiovascular:     Rate and Rhythm: Normal rate and regular rhythm.     Heart sounds: No murmur heard. Pulmonary:     Effort: Pulmonary effort is normal. No respiratory distress.     Breath sounds: Normal breath sounds.  Chest:     Chest wall: No tenderness.  Abdominal:     Palpations: Abdomen is soft.     Tenderness: There is no abdominal tenderness.  Musculoskeletal:        General: No swelling.     Cervical back: Normal range of motion. No rigidity or tenderness.  Skin:    General: Skin is warm and dry.     Capillary Refill: Capillary refill takes less than 2 seconds.  Neurological:     General: No focal deficit present.     Mental Status: He is alert and oriented to person, place, and time.     Cranial Nerves: No cranial nerve deficit.     Sensory: No sensory deficit.     Motor: No weakness.     Comments: No upper extremity or lower extremity weakness.  No cranial nerve abnormalities.  Other than the patient with a complaint of some dizziness.  The dizziness can be elicited by moving the chin to the left shoulder right shoulder.  Psychiatric:        Mood and Affect: Mood normal.    ED Results / Procedures / Treatments   Labs (all labs ordered are listed, but only abnormal results are displayed) Labs Reviewed  CBC WITH DIFFERENTIAL/PLATELET - Abnormal; Notable for the following components:      Result Value   RDW 15.6 (*)    All other components within normal limits  COMPREHENSIVE METABOLIC PANEL - Abnormal; Notable for the following components:   Glucose, Bld 115 (*)    Creatinine, Ser 1.62 (*)    Total Protein 8.3 (*)    GFR, Estimated 43 (*)    All other components within normal limits   URINALYSIS, ROUTINE W REFLEX MICROSCOPIC    EKG EKG Interpretation  Date/Time:  Tuesday Mar 28 2022 20:38:05 EDT Ventricular Rate:  64 PR Interval:  255 QRS Duration: 97 QT Interval:  401 QTC Calculation: 414 R Axis:   -18 Text Interpretation: Sinus rhythm Atrial premature complex Prolonged PR interval Borderline left axis deviation Confirmed by Fredia Sorrow [phone]) on 03/28/2022 8:45:53 PM  Radiology CT Head Wo Contrast  Result Date: 03/28/2022 CLINICAL DATA:  Weakness, dizziness EXAM: CT HEAD WITHOUT CONTRAST TECHNIQUE: Contiguous axial images were obtained from the base of the skull through the vertex without intravenous contrast. RADIATION DOSE REDUCTION: This exam was performed according to the departmental dose-optimization program which includes automated exposure control, adjustment of the mA and/or kV according to patient size and/or use of iterative reconstruction technique. COMPARISON:  None Available. FINDINGS: Brain: No evidence of acute infarction, hemorrhage, hydrocephalus, extra-axial collection or mass lesion/mass effect. Mild subcortical white matter and periventricular small vessel ischemic changes. Vascular: Mild intracranial atherosclerosis. Skull: Normal. Negative for fracture or focal lesion. Sinuses/Orbits: The visualized paranasal sinuses are essentially clear. The mastoid air cells are unopacified. Other: None. IMPRESSION: No acute intracranial abnormality. Mild small vessel ischemic changes. Electronically Signed   By: Julian Hy M.D.   On: 03/28/2022 22:26   DG Chest Port 1 View  Result Date: 03/28/2022 CLINICAL DATA:  Generalized weakness and dizziness. EXAM: PORTABLE CHEST 1 VIEW COMPARISON:  04/08/2019 FINDINGS: The heart size and mediastinal contours are within normal limits. Both lungs are clear. The visualized skeletal structures are unremarkable. Calcification of the aorta. IMPRESSION: No active disease. Electronically Signed   By: Lucienne Capers  M.D.   On: 03/28/2022 22:37    Procedures Procedures    Medications Ordered in ED Medications  0.9 %  sodium chloride infusion ( Intravenous New Bag/Given 03/28/22 2256)  sodium chloride 0.9 % bolus 500 mL (500 mLs Intravenous New Bag/Given 03/28/22 2251)  meclizine (ANTIVERT) tablet 25 mg (25 mg Oral Given 03/28/22 2256)    ED Course/ Medical Decision Making/ A&P                           Medical Decision Making Amount and/or Complexity of Data Reviewed Labs: ordered. Radiology: ordered.  Risk Prescription drug management.   Patient without any focal neurodeficit.  Other than the dizziness that can be brought on by moving the chin to the left shoulder and then back to the right shoulder.  Patient states symptoms of been ongoing for over a week.  We will get labs we will give some IV fluids and will get CT head chest x-ray.  Generalized work-up to explain the dizziness.  No particular injuries related to the falls.  But CT head will clear any head injury.  We will do cardiac monitoring.  EKG with out any acute changes.  Did have some PACs.  Give patient oral dose of Antivert to see how it helps.  Patient noted to not have any nausea or vomiting.  Patient's dizziness vertigo-like symptoms completely resolved with Antivert.  Patient's work-up without any acute findings CBC normal.  Complete metabolic panel just significant for a GFR 43 a little bit worse than his GFR's at 2 years ago.  He can follow-up with his primary care doctor to have that rechecked.  Urinalysis negative chest x-ray without any acute findings and CT head without any acute findings.  All of the vertigo symptoms resolved completely with Antivert.  No other focal findings.  Clinically do not feel that this is a central vertigo.   Final Clinical Impression(s) / ED Diagnoses Final diagnoses:  Dizziness  Vertigo    Rx / DC Orders ED Discharge Orders          Ordered    meclizine (ANTIVERT) 25 MG tablet  3  times daily PRN        03/29/22 0046  Fredia Sorrow, MD 03/28/22 2217    Fredia Sorrow, MD 03/29/22 [phone]

## 2022-03-29 MED ORDER — MECLIZINE HCL 25 MG PO TABS: 25.0000 mg | ORAL_TABLET | Freq: Three times a day (TID) | ORAL | 0 refills | Status: DC | PRN

## 2022-03-29 NOTE — Discharge Instructions (Signed)
Make an appointment to follow-up with your regular doctor for reassessment of the dizziness.  Work-up here without any acute findings.  Take the Antivert as directed to help with the dizziness.  It has helped extremely well here tonight.  Return for any new or worse symptoms.  There was some evidence of some renal insufficiency have your primary care doctor follow-up on that.

## 2022-08-24 IMAGING — CT CT HEAD W/O CM
4 series · 16 of 47 positions shown, 18 images · non-contrast
Comparison: None Available.

CLINICAL DATA: Weakness, dizziness



[Series 2: head wo · axial · 0.46mm/px · z∈[-126,-6]mm · 7 of 32 slices shown, 9 images]
[im 4/32  brain]
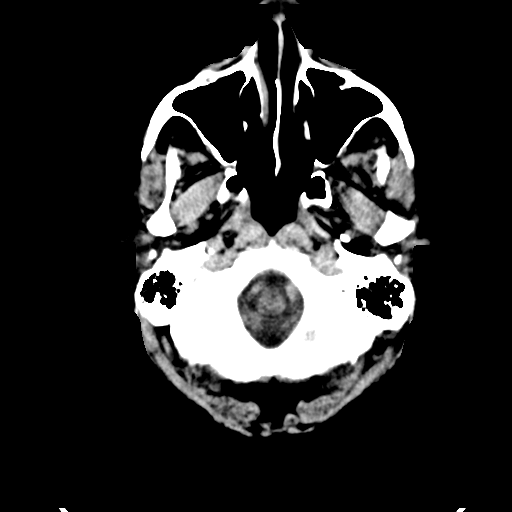
[im 4/32  bone]
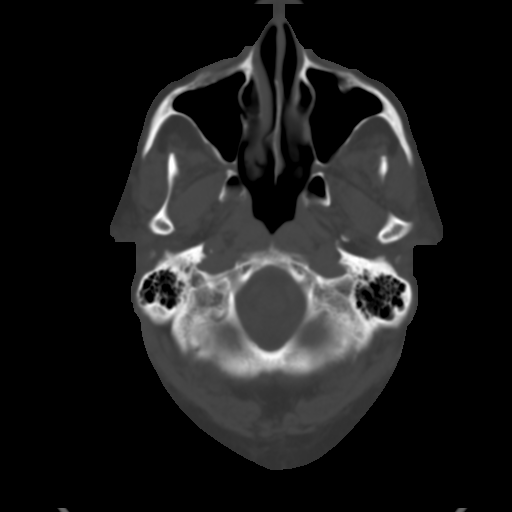
[im 8/32  brain]
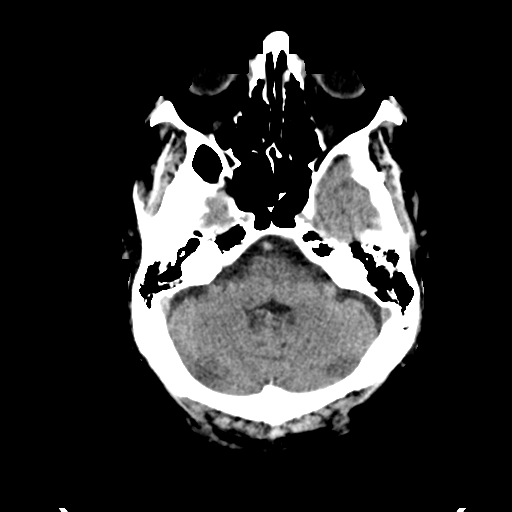
[im 12/32  brain]
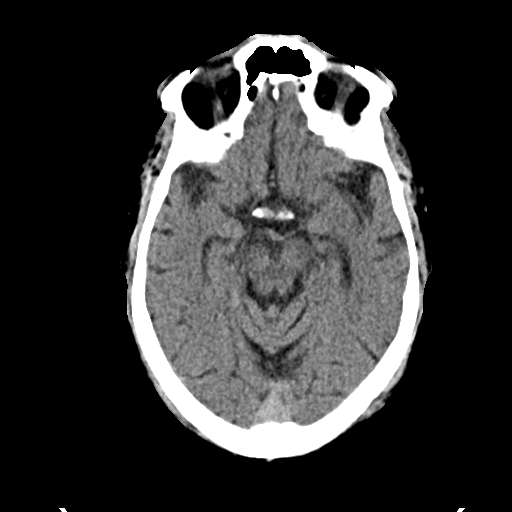
[im 16/32  brain]
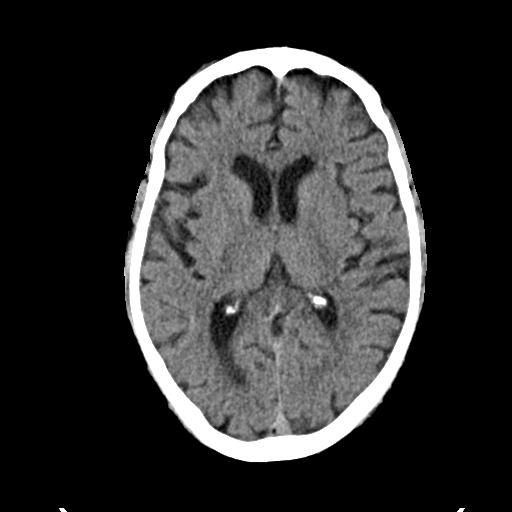
[im 20/32  brain]
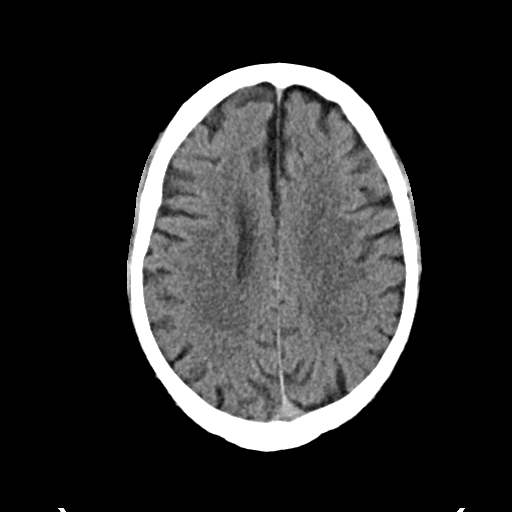
[im 20/32  bone]
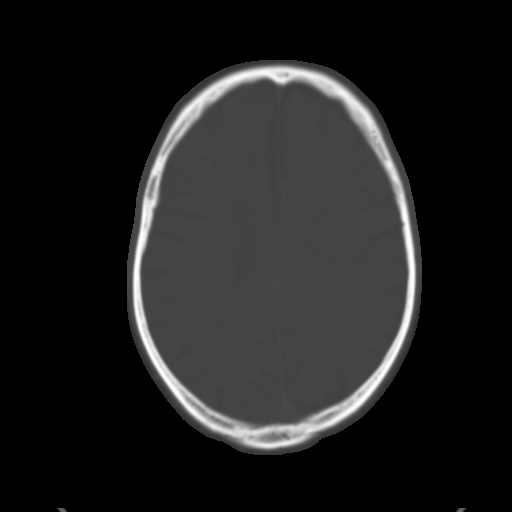
[im 24/32  brain]
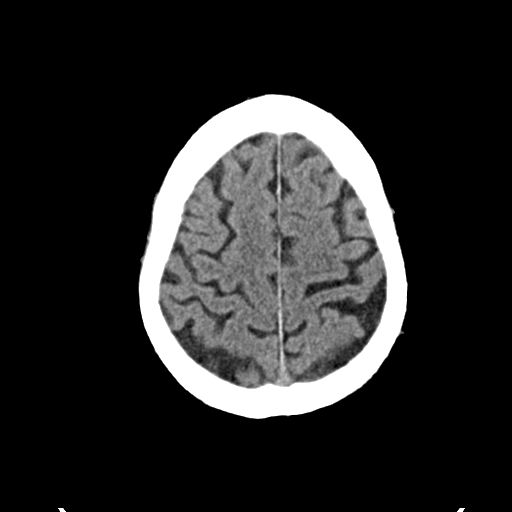
[im 28/32  brain]
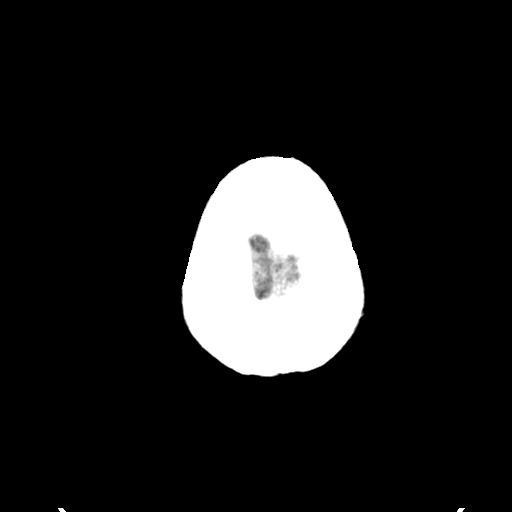

[Series 3: head bone · axial · 0.46mm/px · z∈[-126,-94]mm · 3 of 79 slices shown]
[im 8/79  bone]
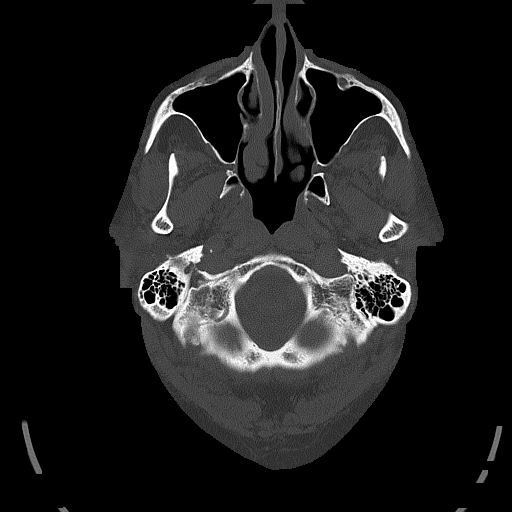
[im 16/79  bone]
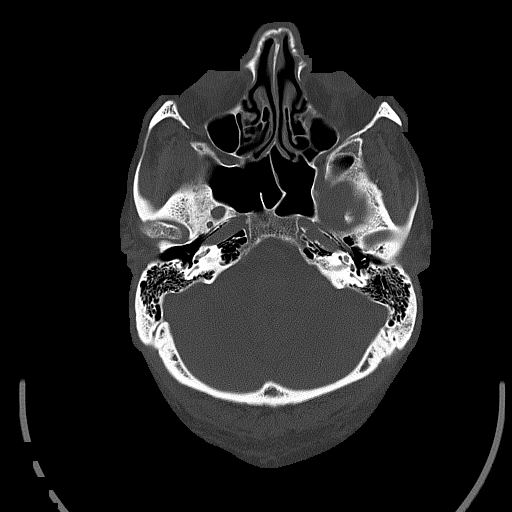
[im 24/79  bone]
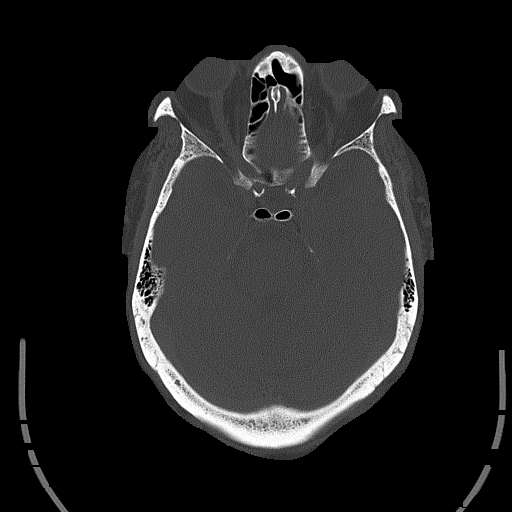

[Series 4: coronal soft · coronal · 0.31mm/px · 3 of 72 slices shown]
[im 24/72  brain]
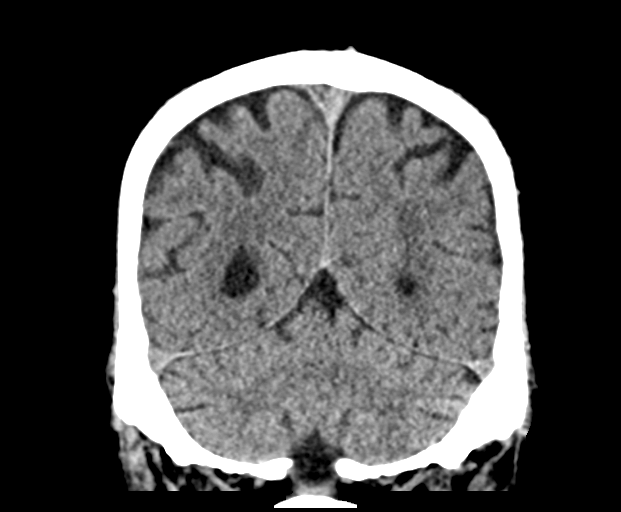
[im 32/72  brain]
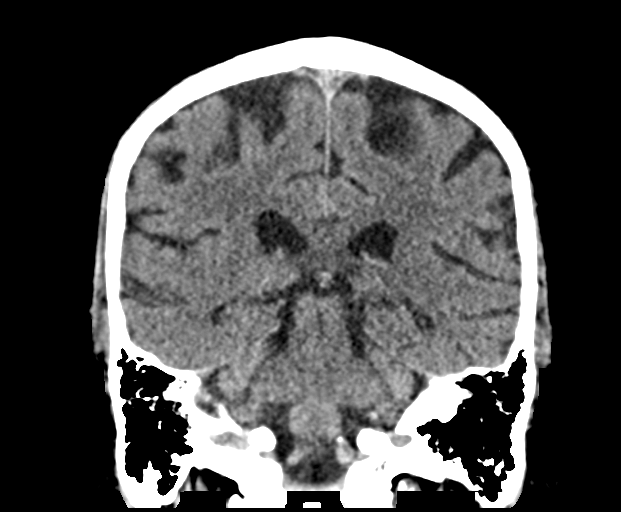
[im 40/72  brain]
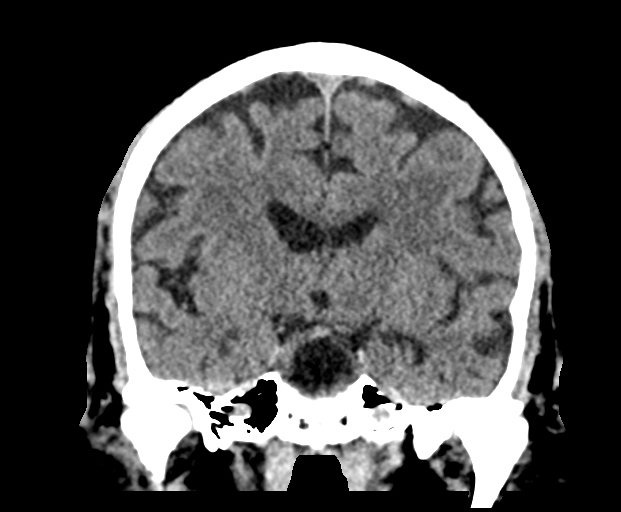

[Series 5: sag soft · sagittal · 0.32mm/px · 3 of 62 slices shown]
[im 21/62  brain]
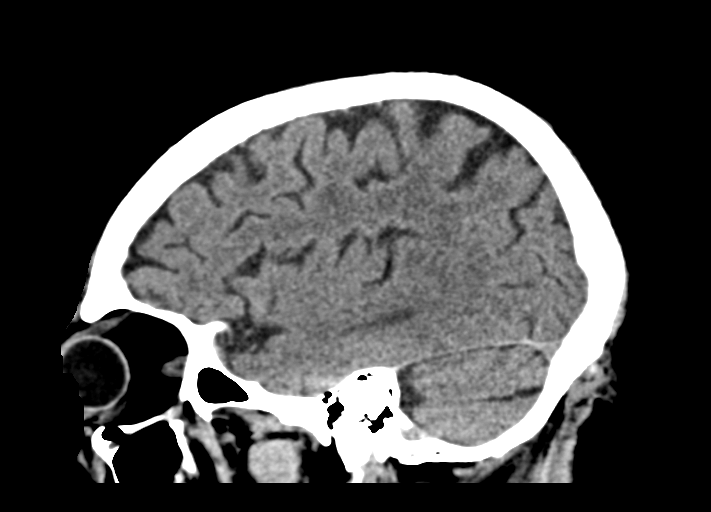
[im 31/62  brain]
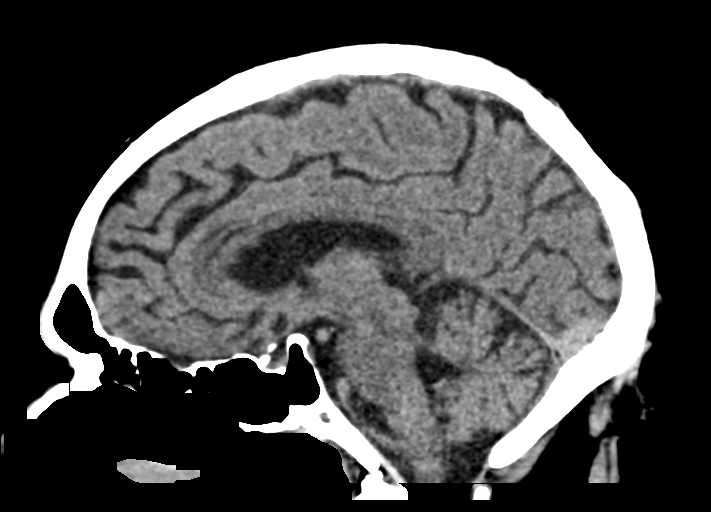
[im 41/62  brain]
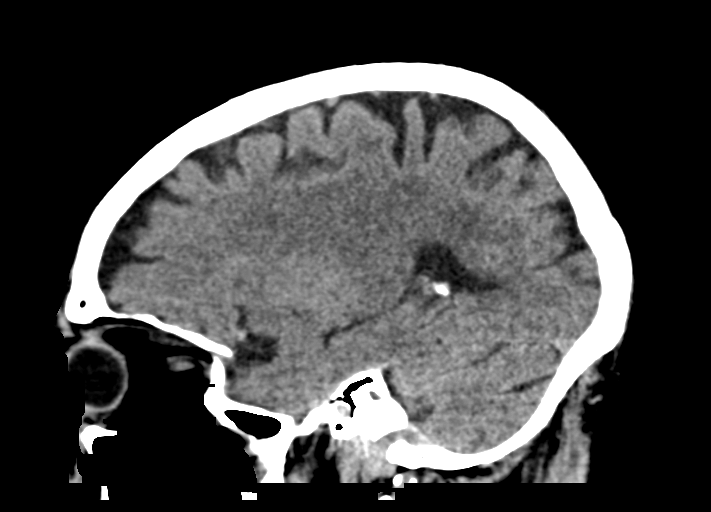

[16 of 47 positions shown; findings below may reference images not displayed]

FINDINGS: Brain: No evidence of acute infarction, hemorrhage, hydrocephalus,
extra-axial collection or mass lesion/mass effect.

Mild subcortical white matter and periventricular small vessel
ischemic changes.

Vascular: Mild intracranial atherosclerosis.

Skull: Normal. Negative for fracture or focal lesion.

Sinuses/Orbits: The visualized paranasal sinuses are essentially
clear. The mastoid air cells are unopacified.

Other: None.
IMPRESSION: No acute intracranial abnormality. Mild small vessel ischemic
changes.

## 2023-03-30 ENCOUNTER — Other Ambulatory Visit: Payer: Self-pay | Admitting: Nurse Practitioner

## 2023-03-30 DIAGNOSIS — I739 Peripheral vascular disease, unspecified: Secondary | ICD-10-CM

## 2023-08-21 ENCOUNTER — Ambulatory Visit: Payer: Medicare HMO | Attending: Cardiology | Admitting: Cardiology

## 2023-08-21 NOTE — Progress Notes (Deleted)
Cardiology Office Note:  .   Date:  08/21/2023  ID:  [name], DOB 1941-08-29, MRN [id number] PCP: Corine Shelter, MD  [facility] Providers Cardiologist:  None { Click to update primary MD,subspecialty MD or APP then REFRESH:1}    No chief complaint on file.   Patient Profile: .     [name] is a 82 y.o. male former smoker with a PMH notable for HTN, HLD, (on High-Dose Atorvastatin), Aortic Atherosclerosis, CKD 3B who presents here for Evaluation Treatment of SVT (and 2  AV block noted on Zio patch), and aortic atherosclerosis at the request of Karenann Cai, NP.  Notable family history for diabetes, hypertension and "heart attack ".     [name] was seen on June 05, 2023 => 2D echo ordered  Subjective   INTERVAL HPI   ROS:  Cardiovascular ROS: {roscv:310661} Review of Systems - {ros master:310782}     Objective   Studies Reviewed: Marland Kitchen        ECHO: *** CATH: *** MONITOR: *** CT: ***  Risk Assessment/Calculations:   {Does this patient have ATRIAL FIBRILLATION?:[phone]} No BP recorded.  {Refresh Note OR Click here to enter BP  :1}***         Physical Exam:   VS:  There were no vitals taken for this visit.   Wt Readings from Last 3 Encounters:  03/28/22 240 lb (108.9 kg)  04/25/19 236 lb 3 oz (107.1 kg)  04/08/19 235 lb 9.6 oz (106.9 kg)    GEN: Well nourished, well developed in no acute distress; *** NECK: No JVD; No carotid bruits CARDIAC: Normal S1, S2; RRR, no murmurs, rubs, gallops RESPIRATORY:  Clear to auscultation without rales, wheezing or rhonchi ; nonlabored, good air movement. ABDOMEN: Soft, non-tender, non-distended EXTREMITIES:  No edema; No deformity      ASSESSMENT AND PLAN: .    Problem List Items Addressed This Visit   None       {Are you ordering a CV Procedure (e.g. stress test, cath, DCCV, TEE, etc)?   Press F2        :[id number]}   Dispo: No follow-ups on file.  Total time spent: *** min  spent with patient + *** min spent charting = *** min      Signed, Marykay Lex, MD, MS Bryan Lemma, M.D., M.S. Interventional Cardiologist  [facility]  Pager # [phone] Phone # [phone] [address] [place], [place] [address]

## 2023-10-29 NOTE — Progress Notes (Signed)
 Primary Care Physician: Zachary JONELLE Hillman Mickey, MD  Reason for visit:  HTN, Diastolic CHF   HPI:  This is pleasant 82 y.o. yrs old male with history of chronic hypertension, type 2 diabetes on oral medication, hyperlipidemia, former smoker who comes in for cardiovascular evaluation.  He is poor historian.  I tried to get some history from granddaughter who was  accompanying him today. As per his granddaughter, patient has history of diastolic heart failure.  He also had noncompliance with medication.  He does have chronic lower extremity edema, takes Lasix.  Patient is noncompliant with diet.  He used to smoke in the past. Patient admits to intermittent chest tightness at rest, is fairly ambulatory.  Upon walking, chest tightness sometimes gets worse.  Has not had recent stress test.  Also, acute shortness of breath on walking more than 1 flight of stairs.  No orthopnea or PND.  He is currently on Lasix 40 mg 2 times a day, misses to take them 3-4 times  times in a week.  There is no recent blood work in the system. It appears that patient had a Zio patch monitor earlier this year.  He was told that he had up to 3 seconds pauses and also few episodes of Mobitz type I AV block, nocturnal.  Repeat monitoring was recommended but has not been completed yet. Patient does not endorse palpitation or lightheadedness or dizziness or syncopal episode.  No unusual fatigue or exercise intolerance.  EKG today shows sinus rhythm, first-degree AV block Allergies: Patient has no known allergies.  Past Medical History: Past Medical History:  Diagnosis Date  . Diabetes mellitus (*)   . Hypertension    History reviewed. No pertinent surgical history. Family History: History reviewed. No pertinent family history. Social History: Social History   Socioeconomic History  . Marital status: Widowed  Tobacco Use  . Smoking status: Former    Current packs/day: 1.00    Average packs/day: 1 pack/day for 54.4  years (54.4 ttl pk-yrs)    Types: Cigarettes    Start date: 06/11/1969    Passive exposure: Past  . Smokeless tobacco: Former  Advertising account planner  . Vaping status: Never Used   Medication list:     Medication Sig Dispense Refill  . donepezil HCl (ARICEPT) 10 mg tablet Take one tablet (10 mg dose) by mouth daily.    . furosemide (LASIX) 40 mg tablet Take one tablet (40 mg dose) by mouth 2 (two) times daily.    . hydrALAzine  HCl (APRESOLINE ) 25 mg tablet Take one tablet (25 mg dose) by mouth 4 (four) times daily. 120 tablet 5  . JARDIANCE 10 MG TABS tablet one tablet (10 mg dose) daily.    . metformin (GLUCOPHAGE) 1000 MG tablet Take one tablet (1,000 mg dose) by mouth 2 (two) times daily.    . metoprolol  succinate (TOPROL -XL) 100 mg 24 hr tablet Take one tablet (100 mg dose) by mouth daily.    . Multiple Vitamins-Minerals (MULTIVITAL PO) Take by mouth.    . pioglitazone (ACTOS) 15 MG tablet Take one tablet (15 mg dose) by mouth daily.     No current facility-administered medications for this visit.    ROS:The patient denies nausea, vomiting, diarrhea, fever, chills, or bleeding. All other systems are reviewed and are negative except for that mentioned in the history of present illness.  Physical Exam:   Vital Signs: BP (!) 154/79 (BP Location: Right Upper Arm, Patient Position: Sitting)   Pulse 64  Ht 5' 11.5 (1.816 m)   Wt 210 lb (95.3 kg)   SpO2 95%   BMI 28.88 kg/m  Constitutional: Well-nourished well-developed ,  no acute distress.  HENT:normocephalic,atraumatic,oropharynx moist,no oral exudates.   Neck: Supple. No JVD. Carotid bruits: Absent bilaterally. Eyes: Conjunctiva normal, no discharge. Lymphatic:no lymphadenopathy noted. Cardiovascular: RRR.  Soft systolic murmur at right upper sternal border.  No gallop. No rub. Respiratory: Mildly decreased breath sounds bilaterally.  No wheezes or crackles noted GI: Soft, nontender, nondistended, with normal bowel sounds, no masses or  hepatosplenomegaly.  Skin: Warm, dry, no erythema, no rash. Musculoskeletal: No edema, no tenderness, no cyanosis, no clubbing. Pulses: 1+ and intact bilaterally Neurological:  Cranial nerves are intact with no focal deficits. Psychiatric: Alert and oriented to person, place and time, normal affect.  IMPRESSION AND PLAN    1. Chronic diastolic congestive heart failure (*)   2. Hypertension, unspecified type   3. Chest tightness   4. Murmur, cardiac   5. Mobitz type 1 second degree atrioventricular block    Plan:   Blood pressure is elevated.  He could not tolerate amlodipine  secondary to worsening lower extremity edema.  Will add hydralazine  25 mg 3 times daily. From diastolic CHF standpoint, he appears to be compensated.  Continue current dose of Lasix.  Get echocardiogram for cardiac structure/function evaluation.  Patient appears to have mild aortic stenosis by examination. For evaluation of chest tightness, will consider stress test.  Pharmacological stress test will be requested as he is not able to walk in the treadmill. I reviewed Holter monitor report from earlier.  Patient appears to have after 3 seconds nocturnal pause, few episodes of 2: 1 AV block which appear to be Mobitz type I, nocturnal.  Patient remains asymptomatic.  I will recommend getting another monitor for 2 weeks for follow-up.  We had discussion about importance of life style modification, keeping BP, blood sugar and cholesterol, body weight at goal, role of regular exercise (at least 30 minutes of moderate intensity aerobic exercise 5 days in a week), following low fat, low carb, low salt heart healthy diet, abstinence from smoking and drinking etc.  We also discussed about visiting PCP visit for non cardiac issues, importance of regular follow up, health screening  and medication compliance.    Follow up in about 4 months (around 02/27/2024).   Marcello MARLA Lennox, MD   Note: This documented was generated using voice  recognition software. There may be unintended transcription errors that were not detected upon document review.

## 2023-12-04 NOTE — Progress Notes (Deleted)
 Cardiology Office Note:  .   Date:  12/04/2023  ID:  [name], DOB May 04, 1941, MRN [id number] PCP: Corine Shelter, MD  [facility] Providers Cardiologist:  None { Click to update primary MD,subspecialty MD or APP then REFRESH:1}   History of Present Illness: [name] is a 83 y.o. male with history of HTN, DM, HLD who presents for the evaluation of AV block at the request of Corine Shelter, MD.     Problem List HFpEF HTN DM HLD Second degree AV block, Mobitz 2 -06/19/2023 6.CHB -nocturnal  7. Sinus pauses  -3.2 sec (sleep) 8. SVT -longest duration 4.8 s 9. CKD 3b    ROS: All other ROS reviewed and negative. Pertinent positives noted in the HPI.     Studies Reviewed: Marland Kitchen        TTE 11/22/2023 Left Ventricle: Left ventricle size is normal.    Left Ventricle: There is mild concentric hypertrophy.    Left Ventricle: EF: 55-60%.    Left Ventricle: Doppler parameters consistent with mild diastolic  dysfunction and low to normal LA pressure.    Right Ventricle: Right ventricle size is normal. Systolic function is  normal.   Mitral Valve: There is mild regurgitation with a centrally directed  jet.   Aortic Valve: There is at least  moderate stenosis, with peak and mean  gradients of 42.0 and  20.0 mmHg respectively- gradients could be  underestimated due to techincal issues.  AVA by planimetry was around 1.1  sq cm. Mild aortic valve regurgitation with centrally directed jet.    Tricuspid Valve: The right ventricular systolic pressure is moderately  elevated (50-59 mmHg).    Please corelate clinically.   14 day monitor 11/28/2023 The patient was monitored for a total of 14d 15h, underlying rhythm is Sinus. The minimum heart rate was 53 bpm; the maximum 151 bpm; the average 73 bpm.  No atrial flutter/fibrillation.  Evidence of high-grade AV block.  There were 2 pauses, the longest pause was 4111 ms at Day 5 / 03:47:57 pm, during sleep, asymptomatic   Total count of Ventricular Tachycardia (VT): 2 episode(s). Longest VT: 3 beats on Day 5 / 08:13:05 pm. Fastest VT: 133 bpm on Day 5 / 08:13:07 pm.  75 supraventricular episodes were found. Longest SVT Episode 07s, Fastest SVT 179 bpm  There were a total of 3226 PVCs with 3 morphologies and 13 couplets. Overall PVC Burden at 0.23 %  Total PAC burden of 0.3%  Physical Exam:   VS:  There were no vitals taken for this visit.   Wt Readings from Last 3 Encounters:  03/28/22 240 lb (108.9 kg)  04/25/19 236 lb 3 oz (107.1 kg)  04/08/19 235 lb 9.6 oz (106.9 kg)    GEN: Well nourished, well developed in no acute distress NECK: No JVD; No carotid bruits CARDIAC: ***RRR, no murmurs, rubs, gallops RESPIRATORY:  Clear to auscultation without rales, wheezing or rhonchi  ABDOMEN: Soft, non-tender, non-distended EXTREMITIES:  No edema; No deformity  ASSESSMENT AND PLAN: .   ***    {Are you ordering a CV Procedure (e.g. stress test, cath, DCCV, TEE, etc)?   Press F2        :[id number]}   Follow-up: No follow-ups on file.  Time Spent with Patient: I have spent a total of *** minutes caring for this patient today face to face, ordering and reviewing labs/tests, reviewing prior records/medical history, examining the patient, establishing an assessment and plan,  communicating results/findings to the patient/family, and documenting in the medical record.   Signed, Lenna Gilford. Flora Lipps, MD, [facility]  [facility]  [address] [place], [place] [address] [phone]  11:55 AM

## 2023-12-07 ENCOUNTER — Ambulatory Visit: Payer: Medicare HMO | Admitting: Cardiovascular Disease

## 2023-12-07 DIAGNOSIS — I150 Renovascular hypertension: Secondary | ICD-10-CM

## 2023-12-07 DIAGNOSIS — I471 Supraventricular tachycardia, unspecified: Secondary | ICD-10-CM

## 2023-12-07 DIAGNOSIS — I5032 Chronic diastolic (congestive) heart failure: Secondary | ICD-10-CM

## 2023-12-07 DIAGNOSIS — I441 Atrioventricular block, second degree: Secondary | ICD-10-CM

## 2023-12-07 DIAGNOSIS — I455 Other specified heart block: Secondary | ICD-10-CM

## 2023-12-07 DIAGNOSIS — E782 Mixed hyperlipidemia: Secondary | ICD-10-CM

## 2024-07-28 ENCOUNTER — Encounter (HOSPITAL_COMMUNITY): Payer: Self-pay

## 2024-07-28 ENCOUNTER — Other Ambulatory Visit: Payer: Self-pay

## 2024-07-28 ENCOUNTER — Inpatient Hospital Stay (HOSPITAL_COMMUNITY)
Admission: EM | Admit: 2024-07-28 | Discharge: 2024-08-01 | DRG: 682 | Disposition: A | Discharge status: Home / Self Care | Attending: Internal Medicine | Admitting: Internal Medicine

## 2024-07-28 DIAGNOSIS — Z7984 Long term (current) use of oral hypoglycemic drugs: Secondary | ICD-10-CM

## 2024-07-28 DIAGNOSIS — Z6822 Body mass index (BMI) 22.0-22.9, adult: Secondary | ICD-10-CM

## 2024-07-28 DIAGNOSIS — Z79899 Other long term (current) drug therapy: Secondary | ICD-10-CM | POA: Diagnosis not present

## 2024-07-28 DIAGNOSIS — N1832 Chronic kidney disease, stage 3b: Secondary | ICD-10-CM | POA: Diagnosis present

## 2024-07-28 DIAGNOSIS — I2510 Atherosclerotic heart disease of native coronary artery without angina pectoris: Secondary | ICD-10-CM | POA: Diagnosis present

## 2024-07-28 DIAGNOSIS — E860 Dehydration: Secondary | ICD-10-CM | POA: Diagnosis present

## 2024-07-28 DIAGNOSIS — E861 Hypovolemia: Secondary | ICD-10-CM | POA: Diagnosis present

## 2024-07-28 DIAGNOSIS — E785 Hyperlipidemia, unspecified: Secondary | ICD-10-CM | POA: Diagnosis present

## 2024-07-28 DIAGNOSIS — I130 Hypertensive heart and chronic kidney disease with heart failure and stage 1 through stage 4 chronic kidney disease, or unspecified chronic kidney disease: Secondary | ICD-10-CM | POA: Diagnosis present

## 2024-07-28 DIAGNOSIS — Z87891 Personal history of nicotine dependence: Secondary | ICD-10-CM

## 2024-07-28 DIAGNOSIS — H9190 Unspecified hearing loss, unspecified ear: Secondary | ICD-10-CM | POA: Diagnosis present

## 2024-07-28 DIAGNOSIS — Z923 Personal history of irradiation: Secondary | ICD-10-CM

## 2024-07-28 DIAGNOSIS — Z955 Presence of coronary angioplasty implant and graft: Secondary | ICD-10-CM | POA: Diagnosis not present

## 2024-07-28 DIAGNOSIS — E1165 Type 2 diabetes mellitus with hyperglycemia: Secondary | ICD-10-CM | POA: Diagnosis present

## 2024-07-28 DIAGNOSIS — N400 Enlarged prostate without lower urinary tract symptoms: Secondary | ICD-10-CM | POA: Diagnosis present

## 2024-07-28 DIAGNOSIS — Z8546 Personal history of malignant neoplasm of prostate: Secondary | ICD-10-CM

## 2024-07-28 DIAGNOSIS — E876 Hypokalemia: Secondary | ICD-10-CM | POA: Diagnosis present

## 2024-07-28 DIAGNOSIS — I5032 Chronic diastolic (congestive) heart failure: Secondary | ICD-10-CM | POA: Diagnosis present

## 2024-07-28 DIAGNOSIS — E43 Unspecified severe protein-calorie malnutrition: Secondary | ICD-10-CM | POA: Diagnosis present

## 2024-07-28 DIAGNOSIS — E1122 Type 2 diabetes mellitus with diabetic chronic kidney disease: Secondary | ICD-10-CM | POA: Diagnosis present

## 2024-07-28 DIAGNOSIS — N179 Acute kidney failure, unspecified: Secondary | ICD-10-CM | POA: Diagnosis present

## 2024-07-28 LAB — URINALYSIS, ROUTINE W REFLEX MICROSCOPIC
Bilirubin Urine: NEGATIVE
Glucose, UA: NEGATIVE mg/dL
Hgb urine dipstick: NEGATIVE
Ketones, ur: NEGATIVE mg/dL
Leukocytes,Ua: NEGATIVE
Nitrite: NEGATIVE
Protein, ur: 30 mg/dL — AB
Specific Gravity, Urine: 1.015 (ref 1.005–1.030)
pH: 5 (ref 5.0–8.0)

## 2024-07-28 LAB — CBC WITH DIFFERENTIAL/PLATELET
Abs Immature Granulocytes: 0.01 K/uL (ref 0.00–0.07)
Basophils Absolute: 0 K/uL (ref 0.0–0.1)
Basophils Relative: 1 %
Eosinophils Absolute: 0 K/uL (ref 0.0–0.5)
Eosinophils Relative: 1 %
HCT: 37.9 % — ABNORMAL LOW (ref 39.0–52.0)
Hemoglobin: 11.9 g/dL — ABNORMAL LOW (ref 13.0–17.0)
Immature Granulocytes: 0 %
Lymphocytes Relative: 18 %
Lymphs Abs: 1.1 K/uL (ref 0.7–4.0)
MCH: 27.5 pg (ref 26.0–34.0)
MCHC: 31.4 g/dL (ref 30.0–36.0)
MCV: 87.5 fL (ref 80.0–100.0)
Monocytes Absolute: 0.6 K/uL (ref 0.1–1.0)
Monocytes Relative: 10 %
Neutro Abs: 4.3 K/uL (ref 1.7–7.7)
Neutrophils Relative %: 70 %
Platelets: 274 K/uL (ref 150–400)
RBC: 4.33 MIL/uL (ref 4.22–5.81)
RDW: 14.9 % (ref 11.5–15.5)
WBC: 6.1 K/uL (ref 4.0–10.5)
nRBC: 0 % (ref 0.0–0.2)

## 2024-07-28 LAB — COMPREHENSIVE METABOLIC PANEL WITH GFR
ALT: 21 U/L (ref 0–44)
AST: 27 U/L (ref 15–41)
Albumin: 3.9 g/dL (ref 3.5–5.0)
Alkaline Phosphatase: 59 U/L (ref 38–126)
Anion gap: 14 (ref 5–15)
BUN: 68 mg/dL — ABNORMAL HIGH (ref 8–23)
CO2: 25 mmol/L (ref 22–32)
Calcium: 9.3 mg/dL (ref 8.9–10.3)
Chloride: 101 mmol/L (ref 98–111)
Creatinine, Ser: 4.25 mg/dL — ABNORMAL HIGH (ref 0.61–1.24)
GFR, Estimated: 13 mL/min — ABNORMAL LOW (ref >60)
Glucose, Bld: 224 mg/dL — ABNORMAL HIGH (ref 70–99)
Potassium: 3.8 mmol/L (ref 3.5–5.1)
Sodium: 140 mmol/L (ref 135–145)
Total Bilirubin: 0.9 mg/dL (ref 0.0–1.2)
Total Protein: 8.1 g/dL (ref 6.5–8.1)

## 2024-07-28 LAB — OSMOLALITY: Osmolality: 325 mosm/kg (ref 275–295)

## 2024-07-28 MED ORDER — SODIUM CHLORIDE 0.9 % IV BOLUS
1000.0000 mL | Freq: Once | INTRAVENOUS | Status: AC
Start: 2024-07-28 — End: 2024-07-29
  Administered 2024-07-28: 1000 mL via INTRAVENOUS

## 2024-07-28 NOTE — ED Provider Notes (Signed)
 [place] EMERGENCY DEPARTMENT AT [facility] Provider Note   CSN: [id number] Arrival date & time: 07/28/24  8363     Patient presents with: No chief complaint on file.   [name] is a 83 y.o. male.   HPI  Patient presents because of lack of appetite and AKI.  According to family at bedside, patient's lost about 3040 pounds over the past couple of months.  Has had decreased p.o. intake because of lack of appetite.  Per patient, he just feels like is not hungry ever.  He endorses episodes of nausea but no episodes of vomiting.  Bowel moods been regular.  No significant abdominal pain.  No night sweats.  No lymphadenopathy.  Patient states she just feels like not eating or drinking.  Went to outside facility where they obtained labs that showed AKI.     Prior to Admission medications   Medication Sig Start Date End Date Taking? Authorizing Provider  amLODipine  (NORVASC ) 10 MG tablet Take 10 mg by mouth daily.  02/20/19   [provider]  atorvastatin  (LIPITOR) 80 MG tablet Take 80 mg by mouth daily.  01/02/19   [provider]  benazepril (LOTENSIN) 40 MG tablet Take 40 mg by mouth daily.  01/02/19   [provider]  docusate sodium  (COLACE) 100 MG capsule Take 1 capsule (100 mg total) by mouth 2 (two) times daily. 04/25/19   Nieves Cough, MD  furosemide (LASIX) 40 MG tablet Take 40 mg by mouth 2 (two) times daily.  12/28/18   [provider]  ibuprofen (ADVIL) 800 MG tablet Take 800 mg by mouth daily.  01/02/19   [provider]  meclizine  (ANTIVERT ) 25 MG tablet Take 1 tablet (25 mg total) by mouth 3 (three) times daily as needed for dizziness. 03/29/22   Zackowski, Scott, MD  metFORMIN (GLUCOPHAGE) 1000 MG tablet Take 1,000 mg by mouth daily with breakfast.  10/03/18   [provider]  metoprolol  succinate (TOPROL -XL) 100 MG 24 hr tablet Take 100 mg by mouth daily.  01/30/19   [provider]  mirabegron  ER  (MYRBETRIQ ) 50 MG TB24 tablet Take 1 tablet (50 mg total) by mouth 1 day or 1 dose for 1 dose. 08/06/19 08/07/19  Bruning, Ashlyn, PA-C  pioglitazone (ACTOS) 15 MG tablet Take 15 mg by mouth daily.  01/27/19   [provider]  tamsulosin  (FLOMAX ) 0.4 MG CAPS capsule Take 1 capsule (0.4 mg total) by mouth daily after supper. 08/06/19   Bruning, Ashlyn, PA-C  Vitamin D, Ergocalciferol, (DRISDOL) 1.25 MG (50000 UT) CAPS capsule Take 50,000 Units by mouth every 7 (seven) days.  02/20/19   [provider]    Allergies: Patient has no known allergies.    Review of Systems  Constitutional:  Negative for chills and fever.  HENT:  Negative for ear pain and sore throat.   Eyes:  Negative for pain and visual disturbance.  Respiratory:  Negative for cough and shortness of breath.   Cardiovascular:  Negative for chest pain and palpitations.  Gastrointestinal:  Negative for abdominal pain and vomiting.  Genitourinary:  Negative for dysuria and hematuria.  Musculoskeletal:  Negative for arthralgias and back pain.  Skin:  Negative for color change and rash.  Neurological:  Negative for seizures and syncope.  All other systems reviewed and are negative.   Updated Vital Signs BP 125/77   Pulse 86   Temp 97.8 F (36.6 C) (Oral)   Resp (!) 26  SpO2 98%   Physical Exam Vitals and nursing note reviewed.  Constitutional:      General: He is not in acute distress.    Appearance: He is well-developed.  HENT:     Head: Normocephalic and atraumatic.  Eyes:     Conjunctiva/sclera: Conjunctivae normal.  Cardiovascular:     Rate and Rhythm: Normal rate and regular rhythm.     Heart sounds: No murmur heard. Pulmonary:     Effort: Pulmonary effort is normal. No respiratory distress.     Breath sounds: Normal breath sounds.  Abdominal:     Palpations: Abdomen is soft.     Tenderness: There is no abdominal tenderness.  Musculoskeletal:        General: No swelling.     Cervical back:  Neck supple.  Skin:    General: Skin is warm and dry.     Capillary Refill: Capillary refill takes less than 2 seconds.  Neurological:     Mental Status: He is alert.  Psychiatric:        Mood and Affect: Mood normal.     (all labs ordered are listed, but only abnormal results are displayed) Labs Reviewed  CBC WITH DIFFERENTIAL/PLATELET - Abnormal; Notable for the following components:      Result Value   Hemoglobin 11.9 (*)    HCT 37.9 (*)    All other components within normal limits  COMPREHENSIVE METABOLIC PANEL WITH GFR - Abnormal; Notable for the following components:   Glucose, Bld 224 (*)    BUN 68 (*)    Creatinine, Ser 4.25 (*)    GFR, Estimated 13 (*)    All other components within normal limits  URINALYSIS, ROUTINE W REFLEX MICROSCOPIC - Abnormal; Notable for the following components:   Color, Urine AMBER (*)    APPearance HAZY (*)    Protein, ur 30 (*)    Bacteria, UA RARE (*)    All other components within normal limits  OSMOLALITY - Abnormal; Notable for the following components:   Osmolality 325 (*)    All other components within normal limits  OSMOLALITY, URINE  UREA  NITROGEN, URINE  CREATININE, URINE, RANDOM  SODIUM, URINE, RANDOM    EKG: None  Radiology: No results found.   Procedures   Medications Ordered in the ED  sodium chloride  0.9 % bolus 1,000 mL (1,000 mLs Intravenous New Bag/Given 07/28/24 2122)                                    Medical Decision Making Amount and/or Complexity of Data Reviewed Labs: ordered.  Risk Decision regarding hospitalization.   Patient presents because of lack of appetite and AKI.  According to family at bedside, patient's lost about 3040 pounds over the past couple of months.  Has had decreased p.o. intake because of lack of appetite.  Per patient, he just feels like is not hungry ever.  He endorses episodes of nausea but no episodes of vomiting.  Bowel moods been regular.  No significant abdominal  pain.  No night sweats.  No lymphadenopathy.  Patient states she just feels like not eating or drinking.  Went to outside facility where they obtained labs that showed AKI.  Upon exam, patient hemodynamically stable.  ANO x 3 GCS 15.  Reviewed laboratory workup.  AKI.  Likely prerenal.  Pending urine lytes but this seems to be more likely prerenal.  Patient voiding.  Producing urine.  Do not think there is a postobstructive pathology  He has a completely soft and benign abdomen.  No rebound guarding or tenderness.   Patient admitted for AKI     Final diagnoses:  AKI (acute kidney injury)    ED Discharge Orders     None          Simon Lavonia SAILOR, MD 07/28/24 2230

## 2024-07-28 NOTE — ED Provider Triage Note (Signed)
 Emergency Medicine Provider Triage Evaluation Note  [name] , a 83 y.o. male  was evaluated in triage.  Pt complains of decreased appetite, sent by PCP who is at [facility] for concern for kidney injury and dehydration.  Family member at bedside states that patient has had significantly decreased appetite over the past 6 months, even worse over the past 2 months.  He goes days without eating solid food, survives on boost and soup.  Patient denies abdominal pain.  He has a lot of nausea but no vomiting.  They report significant weight loss, maybe up to 60 pounds, over the past several months.  No difficulty with swallowing reported, just lack of appetite.  Family member does not have copies or know what the labs values were.  Review of Systems  Positive: Weight loss, nausea Negative: Dysphagia  Physical Exam  BP (!) 113/97   Pulse (!) 103   Temp 98 F (36.7 C)   Resp 16   SpO2 98%  Gen:   Awake, no distress   Resp:  Normal effort  MSK:   Moves extremities without difficulty  Other:  Mild upper abdominal tenderness without rebound or guarding, no distention  Medical Decision Making  Medically screening exam initiated at 4:58 PM.  Appropriate orders placed.  Cain W Vandergrift was informed that the remainder of the evaluation will be completed by another provider, this initial triage assessment does not replace that evaluation, and the importance of remaining in the ED until their evaluation is complete.     Desiderio Chew, PA-C 07/28/24 1659

## 2024-07-28 NOTE — H&P (Incomplete)
 History and Physical  [name] FMW:[id number] DOB: May 20, 1941 DOA: 07/28/2024  Referring physician: Dr. Simon, EDP  PCP: [facility], [facility]  Outpatient Specialists: Cardiology, urology. Patient coming from: Home  Chief Complaint: Abnormal lab results, referred by PCP.  HPI: [name] is a 83 y.o. male with medical history significant for chronic HFpEF, hypertension, hyperlipidemia, type 2 diabetes, CKD 3B, prostate cancer status post seed implant and external radiation in 2020, who presents to the ER, referred by his PCP due to abnormal lab results.  The patient was found to have elevated creatinine greater than 4, above his baseline.  Endorses poor appetite and oral intake.  Associated with nausea with 1 episode of vomiting last week.  Lost about 60 pounds in 4 months per his daughter at bedside.  No reported subjective fevers or chills.  Has generalized weakness.  In the ER, hypovolemic on exam.  Creatinine 4.25 with GFR of 13.  IV fluid was initiated in the ER.  No hydronephrosis or acute findings on renal ultrasound.  ED Course: Temperature 98.4.  BP 118/79, pulse 85, respiratory rate 13, O2 saturation 99% on room air.  Review of Systems: Review of systems as noted in the HPI. All other systems reviewed and are negative.   Past Medical History:  Diagnosis Date   CKD (chronic kidney disease), stage III (HCC)    followed by pcp--- renal ultrasound in epic 11-11-2018   Coronary artery disease 04-24-2019 per pt followed by pcp,  has not seen cardiologist over 10 yrs ago and has not had a stress test since 2006/  denies cardiac S&S   hx cardiac cath w/ PCI with DES x2 to RCA 06-04-2002 and balloon angioplasty to in-stent restenosis RCA 03-08-2005   Dry skin    Edema of both lower extremities    HOH (hard of hearing)    Hyperlipidemia    Hyperplasia of prostate with lower urinary tract symptoms (LUTS)    Hypertension    Prostate cancer [facility]) urologist-  dr eskridge/  oncologist-   dr patrcia   dx 01-14-2019,  Stage T1c,  Gleason 4+3   Rash    04-24-2019  pt reports has a rash on chest and left side of rib cage,  this is not new, he has had it for a while, does not itch   S/P drug eluting coronary stent placement 06/04/2002   x2  DES to RCA   Type 2 diabetes mellitus (HCC)    followed by pcp   Past Surgical History:  Procedure Laterality Date   CARDIAC CATHETERIZATION  1997   cad   CARDIAC CATHETERIZATION  12-08-2002    dr ladona   previous stents are patent RCA/  luminal irregularites mLAD, pLAD, and Cfx   CATARACT EXTRACTION W/ INTRAOCULAR LENS  IMPLANT, BILATERAL  2005  approx.   CORONARY ANGIOPLASTY  03-08-2005    dr gamble   balloon angioplasty to in-stent restenosis@ junction between prox and distal stents in RCA/  mLAD and mCFx 50% stenosis/  ef 60-65%   CORONARY ANGIOPLASTY WITH STENT PLACEMENT  06-04-2002   dr berry   PCI and DES x2 to mid and prox RCA overlapping ([place])/  mild cad mLAD and mCFx, nonobstructive;  LVEF 60%   PROSTATE BIOPSY  01-14-2019   dr eskridge office   RADIOACTIVE SEED IMPLANT N/A 04/25/2019   Procedure: RADIOACTIVE SEED IMPLANT/BRACHYTHERAPY IMPLANT;  Surgeon: Nieves Cough, MD;  Location[facility];  Service: Urology;  Laterality: N/A;  SPACE OAR INSTILLATION N/A 04/25/2019   Procedure: SPACE OAR INSTILLATION;  Surgeon: Nieves Cough, MD;  Location: [facility];  Service: Urology;  Laterality: N/A;    Social History:  reports that he quit smoking about 30 years ago. His smoking use included cigarettes. He started smoking about 65 years ago. He has a 52.5 pack-year smoking history. He has never used smokeless tobacco. He reports that he does not currently use alcohol. He reports that he does not use drugs.   No Known Allergies  Family History  Problem Relation Age of Onset   Cancer Neg Hx       Prior to Admission medications   Medication Sig Start Date End Date Taking? Authorizing  Provider  amLODipine  (NORVASC ) 10 MG tablet Take 10 mg by mouth daily.  02/20/19   [provider]  atorvastatin  (LIPITOR) 80 MG tablet Take 80 mg by mouth daily.  01/02/19   [provider]  benazepril (LOTENSIN) 40 MG tablet Take 40 mg by mouth daily.  01/02/19   [provider]  docusate sodium  (COLACE) 100 MG capsule Take 1 capsule (100 mg total) by mouth 2 (two) times daily. 04/25/19   Nieves Cough, MD  furosemide (LASIX) 40 MG tablet Take 40 mg by mouth 2 (two) times daily.  12/28/18   [provider]  ibuprofen (ADVIL) 800 MG tablet Take 800 mg by mouth daily.  01/02/19   [provider]  meclizine  (ANTIVERT ) 25 MG tablet Take 1 tablet (25 mg total) by mouth 3 (three) times daily as needed for dizziness. 03/29/22   Zackowski, Scott, MD  metFORMIN (GLUCOPHAGE) 1000 MG tablet Take 1,000 mg by mouth daily with breakfast.  10/03/18   [provider]  metoprolol  succinate (TOPROL -XL) 100 MG 24 hr tablet Take 100 mg by mouth daily.  01/30/19   [provider]  mirabegron  ER (MYRBETRIQ ) 50 MG TB24 tablet Take 1 tablet (50 mg total) by mouth 1 day or 1 dose for 1 dose. 08/06/19 08/07/19  Bruning, Ashlyn, PA-C  pioglitazone (ACTOS) 15 MG tablet Take 15 mg by mouth daily.  01/27/19   [provider]  tamsulosin  (FLOMAX ) 0.4 MG CAPS capsule Take 1 capsule (0.4 mg total) by mouth daily after supper. 08/06/19   Bruning, Ashlyn, PA-C  Vitamin D, Ergocalciferol, (DRISDOL) 1.25 MG (50000 UT) CAPS capsule Take 50,000 Units by mouth every 7 (seven) days.  02/20/19   [provider]    Physical Exam: BP 125/77   Pulse 86   Temp 97.8 F (36.6 C) (Oral)   Resp (!) 26   SpO2 98%   General: 83 y.o. year-old male well developed well nourished in no acute distress.  Alert and interactive. Cardiovascular: Regular rate and rhythm with no rubs or gallops.  No thyromegaly or JVD noted.  Trace lower extremity edema bilaterally.  Respiratory:  Clear to auscultation with no wheezes or rales. Good inspiratory effort. Abdomen: Soft nontender nondistended with normal bowel sounds x4 quadrants. Muskuloskeletal: No cyanosis or clubbing noted bilaterally Neuro: CN II-XII intact, strength, sensation, reflexes Skin: No ulcerative lesions noted or rashes Psychiatry: Judgement and insight appear normal. Mood is appropriate for condition and setting          Labs on Admission:  Basic Metabolic Panel: Recent Labs  Lab 07/28/24 1709  NA 140  K 3.8  CL 101  CO2 25  GLUCOSE 224*  BUN 68*  CREATININE 4.25*  CALCIUM  9.3   Liver Function Tests: Recent Labs  Lab 07/28/24 1709  AST 27  ALT 21  ALKPHOS 59  BILITOT 0.9  PROT 8.1  ALBUMIN 3.9   No results for input(s): LIPASE, AMYLASE in the last 168 hours. No results for input(s): AMMONIA in the last 168 hours. CBC: Recent Labs  Lab 07/28/24 1709  WBC 6.1  NEUTROABS 4.3  HGB 11.9*  HCT 37.9*  MCV 87.5  PLT 274   Cardiac Enzymes: No results for input(s): CKTOTAL, CKMB, CKMBINDEX, TROPONINI in the last 168 hours.  BNP (last 3 results) No results for input(s): BNP in the last 8760 hours.  ProBNP (last 3 results) No results for input(s): PROBNP in the last 8760 hours.  CBG: No results for input(s): GLUCAP in the last 168 hours.  Radiological Exams on Admission: No results found.  EKG: I independently viewed the EKG done and my findings are as followed: None available at the time of this visit.  Assessment/Plan Present on Admission:  AKI (acute kidney injury)  Principal Problem:   AKI (acute kidney injury)  AKI, likely prerenal in the setting of poor oral intake. Baseline creatinine 1.6 with GFR 43 Presented with creatinine of 4.25 with GFR of 13 Avoid nephrotoxic agents, dehydration and hypotension Monitor urine output Renal ultrasound was nonacute. Repeated BMP after IV fluid improved 3.76.  Unintentional weight loss Per his  daughter he lost 60 pounds in 4 months unintentionally Encourage oral protein calorie intake Close follow-up with PCP  Type 2 diabetes with hyperglycemia Presented with serum glucose of 224 Update A1c level  History of prostate cancer prostate cancer status post seed implant and external radiation in 2020 Followed by Dr. Patrcia- radiation oncology.  Hypertension Resume home regimen Close monitor vital signs  Hyperlipidemia Resume home regimen  Generalized weakness PT OT assessment Fall precautions   Time: 75 minutes.   DVT prophylaxis: Subcu heparin  3 times daily  Code Status: Full code  Family Communication: None at bedside  Disposition Plan: Admitted to telemetry medical unit.  Consults called: None.  Admission status: Inpatient status.   Status is: Inpatient The patient requires at least 2 midnights for further evaluation and treatment of present condition.   Tracy LOISE Hurst MD [facility] Pager [phone]  If 7PM-7AM, please contact night-coverage [email or web address] Password [email or web address]  07/28/2024, 10:18 PM

## 2024-07-28 NOTE — ED Triage Notes (Signed)
 Patient sent by PCP for concerns of dehydration and AKI, patient daughter states that he has had decreased appetite x 3 months with no known cause.

## 2024-07-29 ENCOUNTER — Inpatient Hospital Stay (HOSPITAL_COMMUNITY)

## 2024-07-29 LAB — BASIC METABOLIC PANEL WITH GFR
Anion gap: 15 (ref 5–15)
BUN: 58 mg/dL — ABNORMAL HIGH (ref 8–23)
CO2: 23 mmol/L (ref 22–32)
Calcium: 9 mg/dL (ref 8.9–10.3)
Chloride: 102 mmol/L (ref 98–111)
Creatinine, Ser: 3.59 mg/dL — ABNORMAL HIGH (ref 0.61–1.24)
GFR, Estimated: 16 mL/min — ABNORMAL LOW (ref >60)
Glucose, Bld: 127 mg/dL — ABNORMAL HIGH (ref 70–99)
Potassium: 3.1 mmol/L — ABNORMAL LOW (ref 3.5–5.1)
Sodium: 140 mmol/L (ref 135–145)

## 2024-07-29 LAB — C-REACTIVE PROTEIN: CRP: <0.5 mg/dL (ref <1.0)

## 2024-07-29 LAB — CBC
HCT: 36.5 % — ABNORMAL LOW (ref 39.0–52.0)
HCT: 34.7 % — ABNORMAL LOW (ref 39.0–52.0)
Hemoglobin: 11.6 g/dL — ABNORMAL LOW (ref 13.0–17.0)
Hemoglobin: 11.2 g/dL — ABNORMAL LOW (ref 13.0–17.0)
MCH: 27.8 pg (ref 26.0–34.0)
MCH: 27.7 pg (ref 26.0–34.0)
MCHC: 31.8 g/dL (ref 30.0–36.0)
MCHC: 32.3 g/dL (ref 30.0–36.0)
MCV: 87.3 fL (ref 80.0–100.0)
MCV: 85.7 fL (ref 80.0–100.0)
Platelets: 249 K/uL (ref 150–400)
Platelets: 243 K/uL (ref 150–400)
RBC: 4.18 MIL/uL — ABNORMAL LOW (ref 4.22–5.81)
RBC: 4.05 MIL/uL — ABNORMAL LOW (ref 4.22–5.81)
RDW: 14.7 % (ref 11.5–15.5)
RDW: 14.6 % (ref 11.5–15.5)
WBC: 5.2 K/uL (ref 4.0–10.5)
WBC: 5 K/uL (ref 4.0–10.5)
nRBC: 0 % (ref 0.0–0.2)
nRBC: 0 % (ref 0.0–0.2)

## 2024-07-29 LAB — VITAMIN B12: Vitamin B-12: 698 pg/mL (ref 180–914)

## 2024-07-29 LAB — TSH: TSH: 1.787 u[IU]/mL (ref 0.350–4.500)

## 2024-07-29 LAB — PHOSPHORUS: Phosphorus: 2.5 mg/dL (ref 2.5–4.6)

## 2024-07-29 LAB — GLUCOSE, CAPILLARY: Glucose-Capillary: 117 mg/dL — ABNORMAL HIGH (ref 70–99)

## 2024-07-29 LAB — MAGNESIUM: Magnesium: 1.8 mg/dL (ref 1.7–2.4)

## 2024-07-29 LAB — CREATININE, URINE, RANDOM: Creatinine, Urine: 71 mg/dL

## 2024-07-29 LAB — HEMOGLOBIN A1C
Hgb A1c MFr Bld: 5.5 % (ref 4.8–5.6)
Mean Plasma Glucose: 111.15 mg/dL

## 2024-07-29 LAB — CREATININE, SERUM
Creatinine, Ser: 3.76 mg/dL — ABNORMAL HIGH (ref 0.61–1.24)
GFR, Estimated: 15 mL/min — ABNORMAL LOW (ref >60)

## 2024-07-29 LAB — SODIUM, URINE, RANDOM: Sodium, Ur: 77 mmol/L

## 2024-07-29 LAB — FOLATE: Folate: >20 ng/mL (ref >5.9)

## 2024-07-29 LAB — OSMOLALITY, URINE: Osmolality, Ur: 421 mosm/kg (ref 300–900)

## 2024-07-29 LAB — SEDIMENTATION RATE: Sed Rate: 25 mm/h — ABNORMAL HIGH (ref 0–16)

## 2024-07-29 MED ORDER — POLYETHYLENE GLYCOL 3350 17 G PO PACK: 17.0000 g | PACK | Freq: Every day | ORAL | Status: DC | PRN

## 2024-07-29 MED ORDER — HEPARIN SODIUM (PORCINE) 5000 UNIT/ML IJ SOLN
5000.0000 [IU] | Freq: Three times a day (TID) | INTRAMUSCULAR | Status: DC
  Administered 2024-07-29 – 2024-08-01 (×9): 5000 [IU] via SUBCUTANEOUS
  Filled 2024-07-29 (×9): qty 1

## 2024-07-29 MED ORDER — AMLODIPINE BESYLATE 10 MG PO TABS
10.0000 mg | ORAL_TABLET | Freq: Every day | ORAL | Status: DC
  Administered 2024-07-29 – 2024-08-01 (×4): 10 mg via ORAL
  Filled 2024-07-29: qty 2
  Filled 2024-07-29 (×3): qty 1

## 2024-07-29 MED ORDER — LACTATED RINGERS IV SOLN: INTRAVENOUS | Status: AC

## 2024-07-29 MED ORDER — PROCHLORPERAZINE EDISYLATE 10 MG/2ML IJ SOLN: 5.0000 mg | Freq: Four times a day (QID) | INTRAMUSCULAR | Status: DC | PRN

## 2024-07-29 MED ORDER — ACETAMINOPHEN 325 MG PO TABS: 650.0000 mg | ORAL_TABLET | Freq: Four times a day (QID) | ORAL | Status: DC | PRN

## 2024-07-29 MED ORDER — MELATONIN 5 MG PO TABS
5.0000 mg | ORAL_TABLET | Freq: Every evening | ORAL | Status: DC | PRN
  Filled 2024-07-29: qty 1

## 2024-07-29 MED ORDER — POTASSIUM CHLORIDE CRYS ER 20 MEQ PO TBCR
40.0000 meq | EXTENDED_RELEASE_TABLET | Freq: Once | ORAL | Status: AC
  Administered 2024-07-29: 40 meq via ORAL
  Filled 2024-07-29: qty 2

## 2024-07-29 MED ORDER — ATORVASTATIN CALCIUM 80 MG PO TABS
80.0000 mg | ORAL_TABLET | Freq: Every day | ORAL | Status: DC
  Administered 2024-07-29 – 2024-08-01 (×4): 80 mg via ORAL
  Filled 2024-07-29: qty 1
  Filled 2024-07-29: qty 2
  Filled 2024-07-29 (×2): qty 1

## 2024-07-29 NOTE — ED Notes (Signed)
 Pt to [place].
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
## 2024-07-29 NOTE — Progress Notes (Addendum)
 PROGRESS NOTE    [name]  FMW:[id number] DOB: Oct 23, 1941 DOA: 07/28/2024 PCP: [facility]  Chief Complaint  Patient presents with   Dehydration    Brief Narrative:   [name] is [name] 83 y.o. male with medical history significant for chronic HFpEF, hypertension, hyperlipidemia, type 2 diabetes, CKD 3B, prostate cancer status post seed implant and external radiation in 2020, who presents to the ER, referred by his PCP due to abnormal lab results, poor PO intake, and weight loss.    Workup ongoing below.    Assessment & Plan:   Principal Problem:   AKI (acute kidney injury)  AKI on CKD IIIb Presented with creatinine 4.25, unclear recent baseline (in 2023, baseline appeared to be 1.6) UA with 0-5 RBC's, 30 mg/dl protein - follow UP/C Renal US  without hydro Improving with IVF, will continue for now  Weight Loss  Poor PO intake  Dysgeusia  60 lbs reported weight loss in 4 months - no hx recent covid infection.  No new meds, they stopped his dementia med (donepezil?) due to concern this may be causing issues (2-8% anorexia per up to date).  I doubt uremia as cause for his sx, though this should be considered pending where his renal function nadirs (would think this would be less likely with his current renal function).  RD c/s Will get CT CAP without contrast, TSH, b12, folate, sed rate, CRP  Hx Prostate Cancer Noted Follow CT CAP above  Hypokalemia Replace cautiously with AKI  Hypertension Amlodipine , holding lasix and hydralazine  for now  T2DM A1c here is 5.5 (no priors for comparison - ? If this is improvement with recent weight loss/poor PO intake) Metformin on hold   Dyslipidemia Lipitor   Obesity Body mass index is 33.02 kg/m.    DVT prophylaxis: heparin  Code Status: full Family Communication: none Disposition:   Status is: Inpatient Remains inpatient appropriate because: need for inpatient care   Consultants:  none  Procedures:   none  Antimicrobials:  Anti-infectives (From admission, onward)    None       Subjective: No complaints Just doesn't have taste for anything - nothing tastes good  Objective: Vitals:   07/29/24 0500 07/29/24 0630 07/29/24 0730 07/29/24 0754  BP: 135/72 122/74 120/67   Pulse: 77 75 74   Resp: 15 17 16    Temp:  98.7 F (37.1 C)    TempSrc:  Oral    SpO2: 100% 100% 98%   Weight:    108.9 kg  Height:    5' 11.5 (1.816 m)    Intake/Output Summary (Last 24 hours) at 07/29/2024 0839 Last data filed at 07/29/2024 0653 Gross per 24 hour  Intake 1240 ml  Output 500 ml  Net 740 ml   Filed Weights   07/29/24 0754  Weight: 108.9 kg    Examination:  General exam: Appears calm and comfortable  Respiratory system: Clear to auscultation. Respiratory effort normal. Cardiovascular system: RRR Gastrointestinal system: Abdomen is nondistended, soft and nontender.  Central nervous system: Alert and oriented. No focal neurological deficits. Extremities: no LEE  Data Reviewed: I have personally reviewed following labs and imaging studies  CBC: Recent Labs  Lab 07/28/24 1709 07/29/24 0100 07/29/24 0417  WBC 6.1 5.2 5.0  NEUTROABS 4.3  --   --   HGB 11.9* 11.6* 11.2*  HCT 37.9* 36.5* 34.7*  MCV 87.5 87.3 85.7  PLT 274 249 243    Basic Metabolic Panel: Recent Labs  Lab 07/28/24  1709 07/29/24 0100 07/29/24 0548  NA 140  --  140  K 3.8  --  3.1*  CL 101  --  102  CO2 25  --  23  GLUCOSE 224*  --  127*  BUN 68*  --  58*  CREATININE 4.25* 3.76* 3.59*  CALCIUM  9.3  --  9.0  MG  --   --  1.8  PHOS  --   --  2.5    GFR: Estimated Creatinine Clearance: 20.1 mL/min (Doreena Maulden) (by C-G formula based on SCr of 3.59 mg/dL (H)).  Liver Function Tests: Recent Labs  Lab 07/28/24 1709  AST 27  ALT 21  ALKPHOS 59  BILITOT 0.9  PROT 8.1  ALBUMIN 3.9    CBG: No results for input(s): GLUCAP in the last 168 hours.   No results found for this or any previous visit  (from the past 240 hours).       Radiology Studies: US  RENAL Result Date: 07/29/2024 CLINICAL DATA:  Acute kidney injury EXAM: RENAL / URINARY TRACT ULTRASOUND COMPLETE COMPARISON:  None Available. FINDINGS: Right Kidney: Renal measurements: 8.6 x 5.0 x 6.1 cm = volume: 139 mL. Echogenicity within normal limits. No suspicious mass or hydronephrosis visualized. Left Kidney: Renal measurements: 9.4 x 5.9 x 5.5 cm = volume: 159 mL. Echogenicity within normal limits. No suspicious mass or hydronephrosis visualized. Bladder: Appears normal for degree of bladder distention. Other: None. IMPRESSION: No acute findings.  No hydronephrosis. Electronically Signed   By: Franky Crease M.D.   On: 07/29/2024 01:41        Scheduled Meds:  amLODipine   10 mg Oral Daily   atorvastatin   80 mg Oral Daily   heparin   5,000 Units Subcutaneous Q8H   Continuous Infusions:  lactated ringers  75 mL/hr at 07/29/24 0145     LOS: 1 day    Time spent: over 30 min     Meliton Monte, MD [facility]   To contact the attending provider between 7A-7P or the covering provider during after hours 7P-7A, please log into the web site [email or web address] and access using universal  [place] password for that web site. If you do not have the password, please call the hospital operator.  07/29/2024, 8:39 AM

## 2024-07-29 NOTE — ED Notes (Addendum)
 Per MD, bladder scan is for PVR. Pt had just urinated in urinal prior to leaving for US . Will measure at later time. MD aware.

## 2024-07-29 NOTE — ED Notes (Signed)
 Patient transported to CT

## 2024-07-30 DIAGNOSIS — N179 Acute kidney failure, unspecified: Secondary | ICD-10-CM | POA: Diagnosis not present

## 2024-07-30 DIAGNOSIS — E43 Unspecified severe protein-calorie malnutrition: Secondary | ICD-10-CM | POA: Insufficient documentation

## 2024-07-30 LAB — COMPREHENSIVE METABOLIC PANEL WITH GFR
ALT: 18 U/L (ref 0–44)
AST: 20 U/L (ref 15–41)
Albumin: 3.1 g/dL — ABNORMAL LOW (ref 3.5–5.0)
Alkaline Phosphatase: 47 U/L (ref 38–126)
Anion gap: 11 (ref 5–15)
BUN: 42 mg/dL — ABNORMAL HIGH (ref 8–23)
CO2: 23 mmol/L (ref 22–32)
Calcium: 8.7 mg/dL — ABNORMAL LOW (ref 8.9–10.3)
Chloride: 106 mmol/L (ref 98–111)
Creatinine, Ser: 2.84 mg/dL — ABNORMAL HIGH (ref 0.61–1.24)
GFR, Estimated: 21 mL/min — ABNORMAL LOW (ref >60)
Glucose, Bld: 93 mg/dL (ref 70–99)
Potassium: 3.6 mmol/L (ref 3.5–5.1)
Sodium: 140 mmol/L (ref 135–145)
Total Bilirubin: 1.3 mg/dL — ABNORMAL HIGH (ref 0.0–1.2)
Total Protein: 6.2 g/dL — ABNORMAL LOW (ref 6.5–8.1)

## 2024-07-30 LAB — CBC WITH DIFFERENTIAL/PLATELET
Abs Immature Granulocytes: 0.01 K/uL (ref 0.00–0.07)
Basophils Absolute: 0 K/uL (ref 0.0–0.1)
Basophils Relative: 0 %
Eosinophils Absolute: 0.1 K/uL (ref 0.0–0.5)
Eosinophils Relative: 3 %
HCT: 32.2 % — ABNORMAL LOW (ref 39.0–52.0)
Hemoglobin: 10.3 g/dL — ABNORMAL LOW (ref 13.0–17.0)
Immature Granulocytes: 0 %
Lymphocytes Relative: 22 %
Lymphs Abs: 1.1 K/uL (ref 0.7–4.0)
MCH: 27.3 pg (ref 26.0–34.0)
MCHC: 32 g/dL (ref 30.0–36.0)
MCV: 85.4 fL (ref 80.0–100.0)
Monocytes Absolute: 0.6 K/uL (ref 0.1–1.0)
Monocytes Relative: 11 %
Neutro Abs: 3.2 K/uL (ref 1.7–7.7)
Neutrophils Relative %: 64 %
Platelets: 229 K/uL (ref 150–400)
RBC: 3.77 MIL/uL — ABNORMAL LOW (ref 4.22–5.81)
RDW: 14.6 % (ref 11.5–15.5)
WBC: 5.1 K/uL (ref 4.0–10.5)
nRBC: 0 % (ref 0.0–0.2)

## 2024-07-30 LAB — PHOSPHORUS: Phosphorus: 2.9 mg/dL (ref 2.5–4.6)

## 2024-07-30 LAB — MAGNESIUM: Magnesium: 1.7 mg/dL (ref 1.7–2.4)

## 2024-07-30 LAB — UREA NITROGEN, URINE: Urea Nitrogen, Ur: 565 mg/dL

## 2024-07-30 LAB — CORTISOL: Cortisol, Plasma: 8.7 ug/dL

## 2024-07-30 MED ORDER — IPRATROPIUM-ALBUTEROL 0.5-2.5 (3) MG/3ML IN SOLN: 3.0000 mL | RESPIRATORY_TRACT | Status: DC | PRN

## 2024-07-30 MED ORDER — GLUCAGON HCL RDNA (DIAGNOSTIC) 1 MG IJ SOLR: 1.0000 mg | INTRAMUSCULAR | Status: DC | PRN

## 2024-07-30 MED ORDER — MAGNESIUM OXIDE -MG SUPPLEMENT 400 (240 MG) MG PO TABS
800.0000 mg | ORAL_TABLET | Freq: Once | ORAL | Status: AC
  Administered 2024-07-30: 800 mg via ORAL
  Filled 2024-07-30: qty 2

## 2024-07-30 MED ORDER — ORAL CARE MOUTH RINSE: 15.0000 mL | OROMUCOSAL | Status: DC | PRN

## 2024-07-30 MED ORDER — THIAMINE MONONITRATE 100 MG PO TABS
100.0000 mg | ORAL_TABLET | Freq: Every day | ORAL | Status: DC
  Administered 2024-07-30 – 2024-08-01 (×3): 100 mg via ORAL
  Filled 2024-07-30 (×3): qty 1

## 2024-07-30 MED ORDER — ENSURE PLUS HIGH PROTEIN PO LIQD
237.0000 mL | Freq: Two times a day (BID) | ORAL | Status: DC
  Administered 2024-07-30 – 2024-08-01 (×4): 237 mL via ORAL

## 2024-07-30 MED ORDER — HYDRALAZINE HCL 20 MG/ML IJ SOLN: 10.0000 mg | INTRAMUSCULAR | Status: DC | PRN

## 2024-07-30 MED ORDER — POTASSIUM CHLORIDE CRYS ER 20 MEQ PO TBCR
40.0000 meq | EXTENDED_RELEASE_TABLET | Freq: Once | ORAL | Status: AC
  Administered 2024-07-30: 40 meq via ORAL
  Filled 2024-07-30: qty 2

## 2024-07-30 MED ORDER — METOPROLOL TARTRATE 5 MG/5ML IV SOLN
5.0000 mg | INTRAVENOUS | Status: DC | PRN
Start: 2024-07-30 — End: 2024-08-01

## 2024-07-30 NOTE — Progress Notes (Signed)
 Initial Nutrition Assessment  DOCUMENTATION CODES:   Severe malnutrition in context of chronic illness  INTERVENTION:  Liberalized diet from HH/Carb Mod to regular to provide increased options and promote adequate intake that meets calorie and protein needs  Ensure Plus High Protein po BID, each supplement provides 350 kcal and 20 grams of protein.  Magic cup TID with meals, each supplement provides 290 kcal and 9 grams of protein  Added thiamine  100mg  daily for 5 days  NUTRITION DIAGNOSIS:   Severe Malnutrition related to chronic illness as evidenced by severe muscle depletion, moderate fat depletion, energy intake < or equal to 75% for > or equal to 1 month, percent weight loss.  GOAL:   Patient will meet greater than or equal to 90% of their needs  MONITOR:   PO intake, Supplement acceptance, Labs  REASON FOR ASSESSMENT:   Consult Assessment of nutrition requirement/status  ASSESSMENT:   Pt with hx of heart failure, HLD, diabetes, prostate cancer and CKD 3B. Hx of seed implant and external radiation 2020. Admitted from PCP for abnormal labs, weight loss and poor PO, diagnosed AKI.  Spoke with pt who was sitting in bedside chair at time of assessment. Pt in pleasant mood and was receptive to assessment. Pt reports his appetite has picked up since being admitted but still feels like it is not the best.   Pt reports he has not been eating well at home. States he feels like his appetite just got up and flew away one day. Endorses taste changes and states that has also impacted his appetite. Pt reports his granddaughter lives with him but he is fairly independent and cooks his own meals. Pt reports eating a variety of foods and does not avoid any foods. Pt reports no chewing or swallowing issues. Pt tries to eat 2x per day but he reports sometimes not even eating that much. States this has been going on for the last 6 months. Pt reports no intake of ONS at home, but willing to  try some while admitted.   Per chart review, pt reported 60# wt loss in 4 months to MD. Limited wt hx in chart but pt visited PCP 10/2023 and weighed 95.3 kg, pt now weighs 73 kg. This shows a 24% wt loss in 9 months which is clinically significant for malnutrition. Suspect malnutrition related to multiple chronic illnesses increasing pt's estimated needs and pt has not been meeting those needs for period of time. Pt endorses some muscle weakness but continued to be active up until admission. Nutrition focused physical exam shows moderate fat depletion and moderate to severe muscle depletion. Pt meets criteria for severe malnutrition given weight loss, muscle depletions, and poor intake. Pt reports he was using cane occasionally for mobility assistance. Encouraged pt to continue to use cane and help prevent falls.  Discussed importance of trying to increase intake even with taste changes. Encouraged Ensure intake to help supplement calories and protein. Pt also agreeable to magic cups on trays. Pt appears to be showing signs of refeeding, added thiamine  100 mg daily and repletion has been ordered.  Average Meal Intake: 9/23: 100% x 1 recorded meal   Medications reviewed and include:  Magnesium  oxide Potassium chloride  Lactated ringers   Labs reviewed:  Potassium 3.6 <-- 3.1 BUN 42/ Creatinine 2.84  NUTRITION - FOCUSED PHYSICAL EXAM:  Flowsheet Row Most Recent Value  Orbital Region Moderate depletion  Upper Arm Region Moderate depletion  Thoracic and Lumbar Region Moderate depletion  Buccal Region Moderate  depletion  Temple Region Moderate depletion  Clavicle Bone Region Severe depletion  Clavicle and Acromion Bone Region Severe depletion  Scapular Bone Region Severe depletion  Dorsal Hand Severe depletion  Patellar Region Moderate depletion  Anterior Thigh Region Moderate depletion  Posterior Calf Region Moderate depletion  Edema (RD Assessment) None  Hair Reviewed  Eyes Reviewed   Mouth Reviewed  Skin Reviewed  Nails Reviewed    Diet Order:   Diet Order             Diet regular Room service appropriate? Yes with Assist; Fluid consistency: Thin  Diet effective now                   EDUCATION NEEDS:   Education needs have been addressed  Skin:  Skin Assessment: Reviewed RN Assessment  Last BM:  9/22  Height:   Ht Readings from Last 1 Encounters:  07/29/24 5' 11 (1.803 m)    Weight:   Wt Readings from Last 1 Encounters:  07/29/24 73 kg    Ideal Body Weight:  78.2 kg  BMI:  Body mass index is 22.44 kg/m.  Estimated Nutritional Needs:   Kcal:  1800-2100  Protein:  85-100g  Fluid:  >/= 2L    Josette Glance, MS, RDN, LDN Clinical Dietitian I Please reach out via secure chat

## 2024-07-30 NOTE — Progress Notes (Signed)
 PROGRESS NOTE    [name]  FMW:[id number] DOB: 12/14/1940 DOA: 07/28/2024 PCP: [facility]    Brief Narrative:   83 y.o. male with medical history significant for chronic HFpEF, hypertension, hyperlipidemia, type 2 diabetes, CKD 3B, prostate cancer status post seed implant and external radiation in 2020, who presents to the ER, referred by his PCP due to abnormal lab results, poor PO intake, and weight loss.    Assessment & Plan:  AKI on CKD IIIb Baseline creatinine 2023 1.6, admission creatinine 4.25.  Improving with IV fluids. Renal ultrasound is negative   Weight loss with poor oral intake Dysgeusia -60 pound weight loss in the last 4 months.  No new medications.  CT abdomen pelvis unremarkable for any metastatic disease or significant acute findings. -Folate, B12, CRP, TSH are normal.  A1c 5.5, sed rate 25 which is very nonspecific and barely elevated. -Check random cortisol -Will advise him to go to PCP to ensure he is up-to-date with all his routine screenings - Dietitian   Hx Prostate Cancer -No evidence of recurrence   Hypokalemia -Replete as needed   Hypertension Amlodipine , holding lasix and hydralazine  for now IV as needed   T2DM A1c here is 5.5 (no priors for comparison - ? If this is improvement with recent weight loss/poor PO intake) Metformin on hold    Dyslipidemia Lipitor    Obesity Body mass index is 33.02 kg/m.       DVT prophylaxis: heparin  Code Status: full Family Communication: none Disposition: Discussed with daughter over the phone Hopefully discharge in next 24-48 hours once his renal function improves  PT Follow up Recs: No Pt Follow Up9/23/2025 1304  Subjective: Seen at bedside, sitting in recliner.  Does not have any complaints.  He is not aware that if he is up to date with his cancer screening.  His prostate cancer has been in remission for 4 years He tells me that he does not have any odynophagia or dysphagia.  He only  lacks a desire to eat because food does not taste same to him anymore which is what led to his weight loss.   Examination:  General exam: Appears calm and comfortable  Respiratory system: Clear to auscultation. Respiratory effort normal. Cardiovascular system: S1 & S2 heard, RRR. No JVD, murmurs, rubs, gallops or clicks. No pedal edema. Gastrointestinal system: Abdomen is nondistended, soft and nontender. No organomegaly or masses felt. Normal bowel sounds heard. Central nervous system: Alert and oriented. No focal neurological deficits. Extremities: Symmetric 5 x 5 power. Skin: No rashes, lesions or ulcers Psychiatry: Judgement and insight appear normal. Mood & affect appropriate.                Diet Orders (From admission, onward)     Start     Ordered   07/29/24 0051  Diet heart healthy/carb modified Room service appropriate? Yes; Fluid consistency: Thin  Diet effective now       Question Answer Comment  Diet-HS Snack? Nothing   Room service appropriate? Yes   Fluid consistency: Thin      07/29/24 0050            Objective: Vitals:   07/29/24 1631 07/29/24 2004 07/30/24 0430 07/30/24 0849  BP: 125/72 131/78 130/72 123/63  Pulse: 95 82 77 70  Resp: 19 16 20 18   Temp: 98.3 F (36.8 C) (!) 97.3 F (36.3 C) 99 F (37.2 C) 98.2 F (36.8 C)  TempSrc: Oral Oral Oral Oral  SpO2: 96% 100% 99% 100%  Weight: 73 kg     Height: 5' 11 (1.803 m)       Intake/Output Summary (Last 24 hours) at 07/30/2024 1105 Last data filed at 07/30/2024 0845 Gross per 24 hour  Intake 2336.84 ml  Output 0 ml  Net 2336.84 ml   Filed Weights   07/29/24 0754 07/29/24 1631  Weight: 108.9 kg 73 kg    Scheduled Meds:  amLODipine   10 mg Oral Daily   atorvastatin   80 mg Oral Daily   heparin   5,000 Units Subcutaneous Q8H   Continuous Infusions:  lactated ringers  75 mL/hr at 07/30/24 0807    Nutritional status     Body mass index is 22.44 kg/m.  Data Reviewed:    CBC: Recent Labs  Lab 07/28/24 1709 07/29/24 0100 07/29/24 0417 07/30/24 0418  WBC 6.1 5.2 5.0 5.1  NEUTROABS 4.3  --   --  3.2  HGB 11.9* 11.6* 11.2* 10.3*  HCT 37.9* 36.5* 34.7* 32.2*  MCV 87.5 87.3 85.7 85.4  PLT 274 249 243 229   Basic Metabolic Panel: Recent Labs  Lab 07/28/24 1709 07/29/24 0100 07/29/24 0548 07/30/24 0418  NA 140  --  140 140  K 3.8  --  3.1* 3.6  CL 101  --  102 106  CO2 25  --  23 23  GLUCOSE 224*  --  127* 93  BUN 68*  --  58* 42*  CREATININE 4.25* 3.76* 3.59* 2.84*  CALCIUM  9.3  --  9.0 8.7*  MG  --   --  1.8 1.7  PHOS  --   --  2.5 2.9   GFR: Estimated Creatinine Clearance: 20.7 mL/min (A) (by C-G formula based on SCr of 2.84 mg/dL (H)). Liver Function Tests: Recent Labs  Lab 07/28/24 1709 07/30/24 0418  AST 27 20  ALT 21 18  ALKPHOS 59 47  BILITOT 0.9 1.3*  PROT 8.1 6.2*  ALBUMIN 3.9 3.1*   No results for input(s): LIPASE, AMYLASE in the last 168 hours. No results for input(s): AMMONIA in the last 168 hours. Coagulation Profile: No results for input(s): INR, PROTIME in the last 168 hours. Cardiac Enzymes: No results for input(s): CKTOTAL, CKMB, CKMBINDEX, TROPONINI in the last 168 hours. BNP (last 3 results) No results for input(s): PROBNP in the last 8760 hours. HbA1C: Recent Labs    07/29/24 0417  HGBA1C 5.5   CBG: Recent Labs  Lab 07/29/24 1635  GLUCAP 117*   Lipid Profile: No results for input(s): CHOL, HDL, LDLCALC, TRIG, CHOLHDL, LDLDIRECT in the last 72 hours. Thyroid Function Tests: Recent Labs    07/29/24 0925  TSH 1.787   Anemia Panel: Recent Labs    07/29/24 0925 07/29/24 0930  VITAMINB12  --  698  FOLATE >20.0  --    Sepsis Labs: No results for input(s): PROCALCITON, LATICACIDVEN in the last 168 hours.  No results found for this or any previous visit (from the past 240 hours).       Radiology Studies: CT CHEST ABDOMEN PELVIS WO CONTRAST Result  Date: 07/29/2024 CLINICAL DATA:  unexplained weight loss, AKI. History of prostate carcinoma. * Tracking Code: BO * EXAM: CT CHEST, ABDOMEN AND PELVIS WITHOUT CONTRAST TECHNIQUE: Multidetector CT imaging of the chest, abdomen and pelvis was performed following the standard protocol without IV contrast. RADIATION DOSE REDUCTION: This exam was performed according to the departmental dose-optimization program which includes automated exposure control, adjustment of the mA and/or kV according to patient  size and/or use of iterative reconstruction technique. COMPARISON:  CT scan abdomen and pelvis from 01/31/2019. FINDINGS: CT CHEST FINDINGS Cardiovascular: Normal cardiac size. No pericardial effusion. Mild ectasia of the ascending aorta measuring upto 4.1 cm. There are coronary artery calcifications, in keeping with coronary artery disease. There are also mild-to-moderate peripheral atherosclerotic vascular calcifications of thoracic aorta and its major branches. There is dilation of the main pulmonary trunk measuring up to 3.3 cm, which is nonspecific but can be seen with pulmonary artery hypertension. Mediastinum/Nodes: Visualized thyroid gland appears grossly unremarkable. No solid / cystic mediastinal masses. The esophagus is nondistended precluding optimal assessment. No mediastinal or axillary lymphadenopathy by size criteria. Evaluation of bilateral hila is limited due to lack on intravenous contrast: however, no large hilar lymphadenopathy identified. Lungs/Pleura: The central tracheo-bronchial tree is patent. Mild upper lobe predominant centrilobular emphysematous changes noted. There are patchy areas of linear, plate-like atelectasis and/or scarring throughout bilateral lungs. No mass or consolidation. No pleural effusion or pneumothorax. No suspicious lung nodules. Musculoskeletal: The visualized soft tissues of the chest wall are grossly unremarkable. No suspicious osseous lesions. There are mild to moderate  multilevel degenerative changes in the visualized spine. CT ABDOMEN PELVIS FINDINGS Hepatobiliary: The liver is normal in size. Non-cirrhotic configuration. No suspicious mass. No intrahepatic or extrahepatic bile duct dilation. There are 2, subcentimeter sized calcified gallstones without imaging signs of acute cholecystitis. Normal gallbladder wall thickness. No pericholecystic inflammatory changes. Pancreas: Unremarkable. No pancreatic ductal dilatation or surrounding inflammatory changes. Spleen: Within normal limits. No focal lesion. Adrenals/Urinary Tract: There is diffuse thickening of bilateral adrenal glands, without discrete nodule. Findings are nonspecific but mostly associated with adrenal hyperplasia. No suspicious renal mass within the limitations of this unenhanced exam. There are single bilateral simple renal cysts with largest arising from the left kidney lower pole, medially measuring up to 1.6 x 1.8 cm. Unremarkable urinary bladder. Stomach/Bowel: No disproportionate dilation of the small or large bowel loops. No evidence of abnormal bowel wall thickening or inflammatory changes. The appendix is unremarkable. There are multiple diverticula throughout the colon, without imaging signs of diverticulitis. Vascular/Lymphatic: No ascites or pneumoperitoneum. No abdominal or pelvic lymphadenopathy, by size criteria. No aneurysmal dilation of the major abdominal arteries. There are marked peripheral atherosclerotic vascular calcifications of the aorta and its major branches. Reproductive: Multiple prostatic radiation seeds noted. Other: There is a tiny fat containing umbilical hernia. The soft tissues and abdominal wall are otherwise unremarkable. Musculoskeletal: No suspicious osseous lesions. There are mild multilevel degenerative changes in the visualized spine. IMPRESSION: 1. No metastatic disease identified within the chest, abdomen or pelvis. 2. Multiple other nonacute observations, as described  above. Aortic Atherosclerosis (ICD10-I70.0) and Emphysema (ICD10-J43.9). Electronically Signed   By: Ree Molt M.D.   On: 07/29/2024 10:01   US  RENAL Result Date: 07/29/2024 CLINICAL DATA:  Acute kidney injury EXAM: RENAL / URINARY TRACT ULTRASOUND COMPLETE COMPARISON:  None Available. FINDINGS: Right Kidney: Renal measurements: 8.6 x 5.0 x 6.1 cm = volume: 139 mL. Echogenicity within normal limits. No suspicious mass or hydronephrosis visualized. Left Kidney: Renal measurements: 9.4 x 5.9 x 5.5 cm = volume: 159 mL. Echogenicity within normal limits. No suspicious mass or hydronephrosis visualized. Bladder: Appears normal for degree of bladder distention. Other: None. IMPRESSION: No acute findings.  No hydronephrosis. Electronically Signed   By: Franky Crease M.D.   On: 07/29/2024 01:41           LOS: 2 days   Time spent= 35 mins  Burgess JAYSON Dare, MD [facility]  If 7PM-7AM, please contact night-coverage  07/30/2024, 11:05 AM

## 2024-07-30 NOTE — TOC CM/SW Note (Signed)
 Transition of Care [facility]) - Inpatient Brief Assessment   Patient Details  Name: [name] MRN: [id number] Date of Birth: [birth year]  [facility]) CM/SW Contact:    Tom-Johnson, Vonya Ohalloran Daphne, RN Phone Number: 07/30/2024, 1:15 PM   Clinical Narrative:  Patient presented to the ED from his PCP with abnormal Labs. Patient's Creatinine on admit was 4.25, IV Fluids started and admitted with AKI. Patient endorsed poor appetite with weight loss.  CM spoke with patient at bedside about post hospital transition. Patient states he lives with his grand daughter, Thersia who assists with his care and transports patient to and from his appointments. Has  two supportive children. Has a cane, walker at home.  PCP is at [facility] and uses [facility] on [address].  No PT f/u, no TOC needs or recommendations noted.   Patient not Medically ready for discharge.  CM will continue to follow as patient progresses with care towards discharge.         Transition of Care Asessment: Insurance and Status: Insurance coverage has been reviewed Patient has primary care physician: Yes Home environment has been reviewed: Yes Prior level of function:: Modified Independent Prior/Current Home Services: No current home services Social Drivers of Health Review: SDOH reviewed no interventions necessary Readmission risk has been reviewed: Yes Transition of care needs: no transition of care needs at this time

## 2024-07-30 NOTE — Hospital Course (Addendum)
 Brief Narrative:   83 y.o. male with medical history significant for chronic HFpEF, hypertension, hyperlipidemia, type 2 diabetes, CKD 3B, prostate cancer status post seed implant and external radiation in 2020, who presents to the ER, referred by his PCP due to abnormal lab results, poor PO intake, and weight loss.  During hospitalization he received IV fluids with slowly started improving renal function.  All the metabolic causes for possible weight loss are overall unremarkable.  Patient needed close follow-up with PCP to complete oncologic screening.  Renal function slowly improving with creatinine at 2.30 on the day of discharge.  He was evaluated by physical therapy with no further recommendations.  Blood pressure within lower goal so home hydralazine  and metoprolol  was discontinued.  He will keep holding Lasix as he appears euvolemic and need to have a close follow-up with primary care provider and they can resume when appropriate.  Patient has A1c of 5.5.  He was on metformin which was placed on hold and PCP can restart if appropriate.  Patient will continue on current medications and needed close follow-up with PCP and nephrology for further assistance.  Assessment & Plan:  AKI on CKD IIIb Baseline creatinine 2023 1.6, admission creatinine 4.25.  Improving with IV fluids. Renal ultrasound is negative   Weight loss with poor oral intake Dysgeusia -60 pound weight loss in the last 4 months.  No new medications.  CT abdomen pelvis unremarkable for any metastatic disease or significant acute findings. -Folate, B12, CRP, TSH are normal.  A1c 5.5, sed rate 25 which is very nonspecific and barely elevated. # Random cortisol was normal -Will advise him to go to PCP to ensure he is up-to-date with all his routine screenings - Dietitian   Hx Prostate Cancer -No evidence of recurrence   Hypokalemia -Replete as needed   Hypertension Amlodipine , holding lasix, metoprolol  and  hydralazine  for now IV as needed   T2DM A1c here is 5.5 (no priors for comparison - ? If this is improvement with recent weight loss/poor PO intake) Metformin on hold    Dyslipidemia Lipitor    Obesity Body mass index is 33.02 kg/m.

## 2024-07-30 NOTE — Progress Notes (Signed)
 OT Cancellation Note and discharge  Patient Details Name: [name] MRN: [id number] DOB: [birth year]   Cancelled Treatment:    Reason Eval/Treat Not Completed: OT screened, no needs identified, will sign off. Spoke with dtr who clarifies that granddaughter does live with patient and she also works. In speaking with dtr she feels [name] is good from an ADL position and does not need OT. In speaking to [name] he says he does not have any issues with ADLs. Also it was noted that he ambulated with PT yesterday 700 feet without AD and no issues.   Donny BECKER OT Acute Rehabilitation Services Office [phone]    [place] Dorothyann Distel 07/30/2024, 12:09 PM

## 2024-07-30 NOTE — Plan of Care (Signed)
   Problem: Education: Goal: Knowledge of General Education information will improve Description: Including pain rating scale, medication(s)/side effects and non-pharmacologic comfort measures Outcome: Completed/Met

## 2024-07-30 NOTE — Progress Notes (Signed)
 Mobility Specialist Progress Note:    07/30/24 0945  Mobility  Activity Ambulated with assistance  Level of Assistance Contact guard assist, steadying assist  Assistive Device None  Distance Ambulated (ft) 20 ft  Activity Response Tolerated well  Mobility Referral Yes  Mobility visit 1 Mobility  Mobility Specialist Start Time (ACUTE ONLY) 0935  Mobility Specialist Stop Time (ACUTE ONLY) 0945  Mobility Specialist Time Calculation (min) (ACUTE ONLY) 10 min   Pt received seated EOB w/ bed alarm going off. Agreeable to mobility. No c/o throughout. Ambulated to the BR and around room w/o fault. Left in chair w/ call bell and personal belongings in reach. All needs met. NT aware.  Thersia Minder Mobility Specialist  Please contact vis Secure Chat or  Rehab Office [phone]

## 2024-07-30 NOTE — Discharge Instructions (Signed)

## 2024-07-31 DIAGNOSIS — N179 Acute kidney failure, unspecified: Secondary | ICD-10-CM | POA: Diagnosis not present

## 2024-07-31 LAB — CBC
HCT: 32.9 % — ABNORMAL LOW (ref 39.0–52.0)
Hemoglobin: 10.7 g/dL — ABNORMAL LOW (ref 13.0–17.0)
MCH: 27.9 pg (ref 26.0–34.0)
MCHC: 32.5 g/dL (ref 30.0–36.0)
MCV: 85.7 fL (ref 80.0–100.0)
Platelets: 234 K/uL (ref 150–400)
RBC: 3.84 MIL/uL — ABNORMAL LOW (ref 4.22–5.81)
RDW: 14.5 % (ref 11.5–15.5)
WBC: 4.7 K/uL (ref 4.0–10.5)
nRBC: 0 % (ref 0.0–0.2)

## 2024-07-31 LAB — BASIC METABOLIC PANEL WITH GFR
Anion gap: 8 (ref 5–15)
BUN: 33 mg/dL — ABNORMAL HIGH (ref 8–23)
CO2: 23 mmol/L (ref 22–32)
Calcium: 8.6 mg/dL — ABNORMAL LOW (ref 8.9–10.3)
Chloride: 106 mmol/L (ref 98–111)
Creatinine, Ser: 2.51 mg/dL — ABNORMAL HIGH (ref 0.61–1.24)
GFR, Estimated: 25 mL/min — ABNORMAL LOW (ref >60)
Glucose, Bld: 88 mg/dL (ref 70–99)
Potassium: 3.6 mmol/L (ref 3.5–5.1)
Sodium: 137 mmol/L (ref 135–145)

## 2024-07-31 LAB — MAGNESIUM: Magnesium: 1.7 mg/dL (ref 1.7–2.4)

## 2024-07-31 LAB — PHOSPHORUS: Phosphorus: 2.2 mg/dL — ABNORMAL LOW (ref 2.5–4.6)

## 2024-07-31 MED ORDER — POTASSIUM PHOSPHATES 15 MMOLE/5ML IV SOLN
30.0000 mmol | Freq: Once | INTRAVENOUS | Status: AC
  Administered 2024-07-31: 30 mmol via INTRAVENOUS
  Filled 2024-07-31: qty 10

## 2024-07-31 MED ORDER — MAGNESIUM OXIDE -MG SUPPLEMENT 400 (240 MG) MG PO TABS
800.0000 mg | ORAL_TABLET | Freq: Once | ORAL | Status: AC
  Administered 2024-07-31: 800 mg via ORAL
  Filled 2024-07-31: qty 2

## 2024-07-31 MED ORDER — SODIUM CHLORIDE 0.9 % IV SOLN: INTRAVENOUS | Status: AC

## 2024-07-31 NOTE — Progress Notes (Signed)
 Mobility Specialist Progress Note:    07/31/24 1200  Mobility  Activity Ambulated with assistance  Level of Assistance Contact guard assist, steadying assist  Assistive Device None  Distance Ambulated (ft) 400 ft  Activity Response Tolerated well  Mobility Referral Yes  Mobility visit 1 Mobility  Mobility Specialist Start Time (ACUTE ONLY) 1030  Mobility Specialist Stop Time (ACUTE ONLY) 1043  Mobility Specialist Time Calculation (min) (ACUTE ONLY) 13 min   Pt received in bed agreeable to mobility. No c/o throughout. Required contact guard d/t slight unsteadiness, otherwise tolerated well. Returned to room w/o fault. Left in bed w/ call bell and personal belongings in reach. All needs met.  Thersia Minder Mobility Specialist  Please contact vis Secure Chat or  Rehab Office [phone]

## 2024-07-31 NOTE — Progress Notes (Signed)
 PROGRESS NOTE    [name]  FMW:[id number] DOB: 1941/06/06 DOA: 07/28/2024 [facility]CP: [facility]    Brief Narrative:   83 y.o. male with medical history significant for chronic HFpEF, hypertension, hyperlipidemia, type 2 diabetes, CKD 3B, prostate cancer status post seed implant and external radiation in 2020, who presents to the ER, referred by his PCP due to abnormal lab results, poor PO intake, and weight loss.  During hospitalization he received IV fluids with slowly started improving renal function.  All the metabolic causes for possible weight loss are overall unremarkable.  Assessment & Plan:  AKI on CKD IIIb Baseline creatinine 2023 1.6, admission creatinine 4.25.  Improving with IV fluids. Renal ultrasound is negative   Weight loss with poor oral intake Dysgeusia -60 pound weight loss in the last 4 months.  No new medications.  CT abdomen pelvis unremarkable for any metastatic disease or significant acute findings. -Folate, B12, CRP, TSH are normal.  A1c 5.5, sed rate 25 which is very nonspecific and barely elevated. # Random cortisol was normal -Will advise him to go to PCP to ensure he is up-to-date with all his routine screenings - Dietitian   Hx Prostate Cancer -No evidence of recurrence   Hypokalemia -Replete as needed   Hypertension Amlodipine , holding lasix and hydralazine  for now IV as needed   T2DM A1c here is 5.5 (no priors for comparison - ? If this is improvement with recent weight loss/poor PO intake) Metformin on hold    Dyslipidemia Lipitor    Obesity Body mass index is 33.02 kg/m.       DVT prophylaxis: heparin  Code Status: full Family Communication: none Disposition: Discussed with daughter over the phone Hopefully discharge tomorrow  PT Follow up Recs: No Pt Follow Up9/23/2025 1304  Subjective: Seen at bedside, feeling well no complaints.  Denies any shortness of breath.   Examination:  General exam: Appears calm and  comfortable  Respiratory system: Clear to auscultation. Respiratory effort normal. Cardiovascular system: S1 & S2 heard, RRR. No JVD, murmurs, rubs, gallops or clicks. No pedal edema. Gastrointestinal system: Abdomen is nondistended, soft and nontender. No organomegaly or masses felt. Normal bowel sounds heard. Central nervous system: Alert and oriented. No focal neurological deficits. Extremities: Symmetric 5 x 5 power. Skin: No rashes, lesions or ulcers Psychiatry: Judgement and insight appear normal. Mood & affect appropriate.                Diet Orders (From admission, onward)     Start     Ordered   07/30/24 1206  Diet regular Room service appropriate? Yes with Assist; Fluid consistency: Thin  Diet effective now       Question Answer Comment  Room service appropriate? Yes with Assist   Fluid consistency: Thin      07/30/24 1205            Objective: Vitals:   07/30/24 0849 07/30/24 1816 07/30/24 2045 07/31/24 0400  BP: 123/63 121/68 130/70 117/65  Pulse: 70 81 84 89  Resp: 18 19 18 18   Temp: 98.2 F (36.8 C) 98.2 F (36.8 C) 98.3 F (36.8 C) 98.5 F (36.9 C)  TempSrc: Oral Oral Oral Oral  SpO2: 100% 100% 98% 100%  Weight:      Height:        Intake/Output Summary (Last 24 hours) at 07/31/2024 1155 Last data filed at 07/31/2024 0600 Gross per 24 hour  Intake 2172.01 ml  Output 0 ml  Net 2172.01 ml  Filed Weights   07/29/24 0754 07/29/24 1631  Weight: 108.9 kg 73 kg    Scheduled Meds:  amLODipine   10 mg Oral Daily   atorvastatin   80 mg Oral Daily   feeding supplement  237 mL Oral BID BM   heparin   5,000 Units Subcutaneous Q8H   thiamine   100 mg Oral Daily   Continuous Infusions:  sodium chloride  75 mL/hr at 07/31/24 0916   potassium PHOSPHATE  IVPB (in mmol) 30 mmol (07/31/24 0929)    Nutritional status Signs/Symptoms: severe muscle depletion, moderate fat depletion, energy intake < or equal to 75% for > or equal to 1 month, percent  weight loss Percent weight loss: 24 % (24% loss in 9 months) Interventions: Ensure Enlive (each supplement provides 350kcal and 20 grams of protein), Liberalize Diet, Magic cup Body mass index is 22.44 kg/m.  Data Reviewed:   CBC: Recent Labs  Lab 07/28/24 1709 07/29/24 0100 07/29/24 0417 07/30/24 0418 07/31/24 0415  WBC 6.1 5.2 5.0 5.1 4.7  NEUTROABS 4.3  --   --  3.2  --   HGB 11.9* 11.6* 11.2* 10.3* 10.7*  HCT 37.9* 36.5* 34.7* 32.2* 32.9*  MCV 87.5 87.3 85.7 85.4 85.7  PLT 274 249 243 229 234   Basic Metabolic Panel: Recent Labs  Lab 07/28/24 1709 07/29/24 0100 07/29/24 0548 07/30/24 0418 07/31/24 0415  NA 140  --  140 140 137  K 3.8  --  3.1* 3.6 3.6  CL 101  --  102 106 106  CO2 25  --  23 23 23   GLUCOSE 224*  --  127* 93 88  BUN 68*  --  58* 42* 33*  CREATININE 4.25* 3.76* 3.59* 2.84* 2.51*  CALCIUM  9.3  --  9.0 8.7* 8.6*  MG  --   --  1.8 1.7 1.7  PHOS  --   --  2.5 2.9 2.2*   GFR: Estimated Creatinine Clearance: 23.4 mL/min (A) (by C-G formula based on SCr of 2.51 mg/dL (H)). Liver Function Tests: Recent Labs  Lab 07/28/24 1709 07/30/24 0418  AST 27 20  ALT 21 18  ALKPHOS 59 47  BILITOT 0.9 1.3*  PROT 8.1 6.2*  ALBUMIN 3.9 3.1*   No results for input(s): LIPASE, AMYLASE in the last 168 hours. No results for input(s): AMMONIA in the last 168 hours. Coagulation Profile: No results for input(s): INR, PROTIME in the last 168 hours. Cardiac Enzymes: No results for input(s): CKTOTAL, CKMB, CKMBINDEX, TROPONINI in the last 168 hours. BNP (last 3 results) No results for input(s): PROBNP in the last 8760 hours. HbA1C: Recent Labs    07/29/24 0417  HGBA1C 5.5   CBG: Recent Labs  Lab 07/29/24 1635  GLUCAP 117*   Lipid Profile: No results for input(s): CHOL, HDL, LDLCALC, TRIG, CHOLHDL, LDLDIRECT in the last 72 hours. Thyroid Function Tests: Recent Labs    07/29/24 0925  TSH 1.787   Anemia Panel: Recent  Labs    07/29/24 0925 07/29/24 0930  VITAMINB12  --  698  FOLATE >20.0  --    Sepsis Labs: No results for input(s): PROCALCITON, LATICACIDVEN in the last 168 hours.  No results found for this or any previous visit (from the past 240 hours).       Radiology Studies: No results found.         LOS: 3 days   Time spent= 35 mins    Burgess JAYSON Dare, MD [facility]  If 7PM-7AM, please contact night-coverage  07/31/2024, 11:55  AM

## 2024-07-31 NOTE — Plan of Care (Signed)
 Mr. [name] had a seemingly pleasant day today. He was able to take all of his prescribed medication and he walked within his room, independently quite often. He asked appropriate questions about his plan of care and acute status and appears to have a proper understanding. Mr. [name] is alert and oriented to self, place and time but was not completely sure of his situation upon this nurses arrival this morning. No distress noted.

## 2024-08-01 DIAGNOSIS — E43 Unspecified severe protein-calorie malnutrition: Secondary | ICD-10-CM

## 2024-08-01 LAB — BASIC METABOLIC PANEL WITH GFR
Anion gap: 10 (ref 5–15)
BUN: 27 mg/dL — ABNORMAL HIGH (ref 8–23)
CO2: 22 mmol/L (ref 22–32)
Calcium: 8.2 mg/dL — ABNORMAL LOW (ref 8.9–10.3)
Chloride: 106 mmol/L (ref 98–111)
Creatinine, Ser: 2.3 mg/dL — ABNORMAL HIGH (ref 0.61–1.24)
GFR, Estimated: 28 mL/min — ABNORMAL LOW (ref >60)
Glucose, Bld: 100 mg/dL — ABNORMAL HIGH (ref 70–99)
Potassium: 4.3 mmol/L (ref 3.5–5.1)
Sodium: 138 mmol/L (ref 135–145)

## 2024-08-01 LAB — CBC
HCT: 29.8 % — ABNORMAL LOW (ref 39.0–52.0)
Hemoglobin: 9.5 g/dL — ABNORMAL LOW (ref 13.0–17.0)
MCH: 27.3 pg (ref 26.0–34.0)
MCHC: 31.9 g/dL (ref 30.0–36.0)
MCV: 85.6 fL (ref 80.0–100.0)
Platelets: 236 K/uL (ref 150–400)
RBC: 3.48 MIL/uL — ABNORMAL LOW (ref 4.22–5.81)
RDW: 14.7 % (ref 11.5–15.5)
WBC: 4.5 K/uL (ref 4.0–10.5)
nRBC: 0 % (ref 0.0–0.2)

## 2024-08-01 LAB — MAGNESIUM: Magnesium: 1.7 mg/dL (ref 1.7–2.4)

## 2024-08-01 MED ORDER — VITAMIN B-1 100 MG PO TABS: 100.0000 mg | ORAL_TABLET | Freq: Every day | ORAL | 1 refills | Status: AC

## 2024-08-01 NOTE — Care Management Important Message (Signed)
 Important Message  Patient Details  Name: [name] MRN: [id number] Date of Birth: 10/27/1941   Important Message Given:  Yes - Medicare IM     Claretta Deed 08/01/2024, 3:55 PM

## 2024-08-01 NOTE — Plan of Care (Signed)
  Problem: Clinical Measurements: Goal: Respiratory complications will improve Outcome: Progressing   Problem: Nutrition: Goal: Adequate nutrition will be maintained Outcome: Progressing   

## 2024-08-01 NOTE — Discharge Summary (Signed)
 Physician Discharge Summary   Patient: [name] MRN: [id number] DOB: 1941-06-21  Admit date:     07/28/2024  Discharge date: 08/01/24  Discharge Physician: Amaryllis Dare   PCP: [facility]   Recommendations at discharge:  Please obtain CBC and BMP on follow-up Patient need age-appropriate oncologic screening for concern of weight loss Multiple home medications are placed on hold-please reevaluate and start as appropriate Follow-up with primary care provider Follow-up with nephrology  Discharge Diagnoses: Principal Problem:   AKI (acute kidney injury) Active Problems:   Protein-calorie malnutrition, severe   Hospital Course: Brief Narrative:   83 y.o. male with medical history significant for chronic HFpEF, hypertension, hyperlipidemia, type 2 diabetes, CKD 3B, prostate cancer status post seed implant and external radiation in 2020, who presents to the ER, referred by his PCP due to abnormal lab results, poor PO intake, and weight loss.  During hospitalization he received IV fluids with slowly started improving renal function.  All the metabolic causes for possible weight loss are overall unremarkable.  Patient needed close follow-up with PCP to complete oncologic screening.  Renal function slowly improving with creatinine at 2.30 on the day of discharge.  He was evaluated by physical therapy with no further recommendations.  Blood pressure within lower goal so home hydralazine  and metoprolol  was discontinued.  He will keep holding Lasix as he appears euvolemic and need to have a close follow-up with primary care provider and they can resume when appropriate.  Patient has A1c of 5.5.  He was on metformin which was placed on hold and PCP can restart if appropriate.  Patient will continue on current medications and needed close follow-up with PCP and nephrology for further assistance.  Assessment & Plan:  AKI on CKD IIIb Baseline creatinine 2023 1.6, admission creatinine  4.25.  Improving with IV fluids. Renal ultrasound is negative   Weight loss with poor oral intake Dysgeusia -60 pound weight loss in the last 4 months.  No new medications.  CT abdomen pelvis unremarkable for any metastatic disease or significant acute findings. -Folate, B12, CRP, TSH are normal.  A1c 5.5, sed rate 25 which is very nonspecific and barely elevated. # Random cortisol was normal -Will advise him to go to PCP to ensure he is up-to-date with all his routine screenings - Dietitian   Hx Prostate Cancer -No evidence of recurrence   Hypokalemia -Replete as needed   Hypertension Amlodipine , holding lasix, metoprolol  and hydralazine  for now IV as needed   T2DM A1c here is 5.5 (no priors for comparison - ? If this is improvement with recent weight loss/poor PO intake) Metformin on hold    Dyslipidemia Lipitor    Obesity Body mass index is 33.02 kg/m.      Consultants: None Procedures performed: None Disposition: Home Diet recommendation:  Discharge Diet Orders (From admission, onward)     Start     Ordered   08/01/24 0000  Diet - low sodium heart healthy        08/01/24 1137           Carb modified diet DISCHARGE MEDICATION: Allergies as of 08/01/2024   No Known Allergies      Medication List     PAUSE taking these medications    furosemide 40 MG tablet Wait to take this until your doctor or other care provider tells you to start again. Commonly known as: LASIX Take 40 mg by mouth 2 (two) times daily.   metFORMIN 1000 MG tablet  Wait to take this until your doctor or other care provider tells you to start again. Commonly known as: GLUCOPHAGE Take 1,000 mg by mouth daily with breakfast.       STOP taking these medications    hydrALAZINE  25 MG tablet Commonly known as: APRESOLINE    metoprolol  succinate 100 MG 24 hr tablet Commonly known as: TOPROL -XL       TAKE these medications    amLODipine  10 MG tablet Commonly known as:  NORVASC  Take 10 mg by mouth daily.   atorvastatin  80 MG tablet Commonly known as: LIPITOR Take 80 mg by mouth daily.   Boost/Benefiber Liqd Take 1 Bottle by mouth 2 (two) times daily between meals.   donepezil 10 MG tablet Commonly known as: ARICEPT Take 10 mg by mouth at bedtime.   Mens 50+ Multivitamin Tabs Take 1 tablet by mouth in the morning.   thiamine  100 MG tablet Commonly known as: Vitamin B-1 Take 1 tablet (100 mg total) by mouth daily. Start taking on: August 02, 2024        Follow-up Information     [facility]. Schedule an appointment as soon as possible for a visit in 1 week(s).   Contact information: [address] [place] [address] [phone]                Discharge Exam: Fredricka Weights   07/29/24 0754 07/29/24 1631 08/01/24 0830  Weight: 108.9 kg 73 kg 80 kg   General.  Frail elderly man, in no acute distress. Pulmonary.  Lungs clear bilaterally, normal respiratory effort. CV.  Regular rate and rhythm, no JVD, rub or murmur. Abdomen.  Soft, nontender, nondistended, BS positive. CNS.  Alert and oriented .  No focal neurologic deficit. Extremities.  No edema, no cyanosis, pulses intact and symmetrical.  Condition at discharge: stable  The results of significant diagnostics from this hospitalization (including imaging, microbiology, ancillary and laboratory) are listed below for reference.   Imaging Studies: CT CHEST ABDOMEN PELVIS WO CONTRAST Result Date: 07/29/2024 CLINICAL DATA:  unexplained weight loss, AKI. History of prostate carcinoma. * Tracking Code: BO * EXAM: CT CHEST, ABDOMEN AND PELVIS WITHOUT CONTRAST TECHNIQUE: Multidetector CT imaging of the chest, abdomen and pelvis was performed following the standard protocol without IV contrast. RADIATION DOSE REDUCTION: This exam was performed according to the departmental dose-optimization program which includes automated exposure control, adjustment of the mA and/or kV  according to patient size and/or use of iterative reconstruction technique. COMPARISON:  CT scan abdomen and pelvis from 01/31/2019. FINDINGS: CT CHEST FINDINGS Cardiovascular: Normal cardiac size. No pericardial effusion. Mild ectasia of the ascending aorta measuring upto 4.1 cm. There are coronary artery calcifications, in keeping with coronary artery disease. There are also mild-to-moderate peripheral atherosclerotic vascular calcifications of thoracic aorta and its major branches. There is dilation of the main pulmonary trunk measuring up to 3.3 cm, which is nonspecific but can be seen with pulmonary artery hypertension. Mediastinum/Nodes: Visualized thyroid gland appears grossly unremarkable. No solid / cystic mediastinal masses. The esophagus is nondistended precluding optimal assessment. No mediastinal or axillary lymphadenopathy by size criteria. Evaluation of bilateral hila is limited due to lack on intravenous contrast: however, no large hilar lymphadenopathy identified. Lungs/Pleura: The central tracheo-bronchial tree is patent. Mild upper lobe predominant centrilobular emphysematous changes noted. There are patchy areas of linear, plate-like atelectasis and/or scarring throughout bilateral lungs. No mass or consolidation. No pleural effusion or pneumothorax. No suspicious lung nodules. Musculoskeletal: The visualized soft tissues of the  chest wall are grossly unremarkable. No suspicious osseous lesions. There are mild to moderate multilevel degenerative changes in the visualized spine. CT ABDOMEN PELVIS FINDINGS Hepatobiliary: The liver is normal in size. Non-cirrhotic configuration. No suspicious mass. No intrahepatic or extrahepatic bile duct dilation. There are 2, subcentimeter sized calcified gallstones without imaging signs of acute cholecystitis. Normal gallbladder wall thickness. No pericholecystic inflammatory changes. Pancreas: Unremarkable. No pancreatic ductal dilatation or surrounding  inflammatory changes. Spleen: Within normal limits. No focal lesion. Adrenals/Urinary Tract: There is diffuse thickening of bilateral adrenal glands, without discrete nodule. Findings are nonspecific but mostly associated with adrenal hyperplasia. No suspicious renal mass within the limitations of this unenhanced exam. There are single bilateral simple renal cysts with largest arising from the left kidney lower pole, medially measuring up to 1.6 x 1.8 cm. Unremarkable urinary bladder. Stomach/Bowel: No disproportionate dilation of the small or large bowel loops. No evidence of abnormal bowel wall thickening or inflammatory changes. The appendix is unremarkable. There are multiple diverticula throughout the colon, without imaging signs of diverticulitis. Vascular/Lymphatic: No ascites or pneumoperitoneum. No abdominal or pelvic lymphadenopathy, by size criteria. No aneurysmal dilation of the major abdominal arteries. There are marked peripheral atherosclerotic vascular calcifications of the aorta and its major branches. Reproductive: Multiple prostatic radiation seeds noted. Other: There is a tiny fat containing umbilical hernia. The soft tissues and abdominal wall are otherwise unremarkable. Musculoskeletal: No suspicious osseous lesions. There are mild multilevel degenerative changes in the visualized spine. IMPRESSION: 1. No metastatic disease identified within the chest, abdomen or pelvis. 2. Multiple other nonacute observations, as described above. Aortic Atherosclerosis (ICD10-I70.0) and Emphysema (ICD10-J43.9). Electronically Signed   By: Ree Molt M.D.   On: 07/29/2024 10:01   US  RENAL Result Date: 07/29/2024 CLINICAL DATA:  Acute kidney injury EXAM: RENAL / URINARY TRACT ULTRASOUND COMPLETE COMPARISON:  None Available. FINDINGS: Right Kidney: Renal measurements: 8.6 x 5.0 x 6.1 cm = volume: 139 mL. Echogenicity within normal limits. No suspicious mass or hydronephrosis visualized. Left Kidney: Renal  measurements: 9.4 x 5.9 x 5.5 cm = volume: 159 mL. Echogenicity within normal limits. No suspicious mass or hydronephrosis visualized. Bladder: Appears normal for degree of bladder distention. Other: None. IMPRESSION: No acute findings.  No hydronephrosis. Electronically Signed   By: Franky Crease M.D.   On: 07/29/2024 01:41    Microbiology: Results for orders placed or performed during the hospital encounter of 04/22/19  Novel Coronavirus, NAA (hospital order; send-out to ref lab)     Status: None   Collection Time: 04/22/19  2:03 PM   Specimen: Nasopharyngeal Swab; Respiratory  Result Value Ref Range Status   SARS-CoV-2, NAA NOT DETECTED NOT DETECTED Final    Comment: (NOTE) This test was developed and its performance characteristics determined by [employer]. This test has not been FDA cleared or approved. This test has been authorized by FDA under an Emergency Use Authorization (EUA). This test is only authorized for the duration of time the declaration that circumstances exist justifying the authorization of the emergency use of in vitro diagnostic tests for detection of SARS-CoV-2 virus and/or diagnosis of COVID-19 infection under section 564(b)(1) of the Act, 21 U.S.C. 639aaa-6(a)(8), unless the authorization is terminated or revoked sooner. When diagnostic testing is negative, the possibility of a false negative result should be considered in the context of a patient's recent exposures and the presence of clinical signs and symptoms consistent with COVID-19. An individual without symptoms of COVID-19 and who is not shedding SARS-CoV-2 virus  would expect to have a negative (not detected) result in this assay. Performed  At: [facility] [address] [place], [place] [id number] Jennette Shorter MD Ey:[id number]    Coronavirus Source NASOPHARYNGEAL  Final    Comment: Performed at [facility], [address], [place], [place] [address]     Labs: CBC: Recent Labs  Lab 07/28/24 1709 07/29/24 0100 07/29/24 0417 07/30/24 0418 07/31/24 0415 08/01/24 0402  WBC 6.1 5.2 5.0 5.1 4.7 4.5  NEUTROABS 4.3  --   --  3.2  --   --   HGB 11.9* 11.6* 11.2* 10.3* 10.7* 9.5*  HCT 37.9* 36.5* 34.7* 32.2* 32.9* 29.8*  MCV 87.5 87.3 85.7 85.4 85.7 85.6  PLT 274 249 243 229 234 236   Basic Metabolic Panel: Recent Labs  Lab 07/28/24 1709 07/29/24 0100 07/29/24 0548 07/30/24 0418 07/31/24 0415 08/01/24 0402  NA 140  --  140 140 137 138  K 3.8  --  3.1* 3.6 3.6 4.3  CL 101  --  102 106 106 106  CO2 25  --  23 23 23 22   GLUCOSE 224*  --  127* 93 88 100*  BUN 68*  --  58* 42* 33* 27*  CREATININE 4.25* 3.76* 3.59* 2.84* 2.51* 2.30*  CALCIUM  9.3  --  9.0 8.7* 8.6* 8.2*  MG  --   --  1.8 1.7 1.7 1.7  PHOS  --   --  2.5 2.9 2.2*  --    Liver Function Tests: Recent Labs  Lab 07/28/24 1709 07/30/24 0418  AST 27 20  ALT 21 18  ALKPHOS 59 47  BILITOT 0.9 1.3*  PROT 8.1 6.2*  ALBUMIN 3.9 3.1*   CBG: Recent Labs  Lab 07/29/24 1635  GLUCAP 117*    Discharge time spent: greater than 30 minutes.  This record has been created using Conservation officer, historic buildings. Errors have been sought and corrected,but may not always be located. Such creation errors do not reflect on the standard of care.   Signed: Amaryllis Dare, MD [facility] 08/01/2024

## 2024-08-01 NOTE — TOC Transition Note (Signed)
 Transition of Care [facility]) - Discharge Note   Patient Details  Name: [name] MRN: [id number] Date of Birth: [birth year]  [facility] CM/SW Contact:  Tom-Johnson, Charla Criscione Daphne, RN Phone Number: 08/01/2024, 12:43 PM   Clinical Narrative:     Patient is scheduled for discharge today.  Readmission Risk Assessment done. Hospital f/u and discharge instructions on AVS. No ICM needs or recommendations noted. Daughter, Lenward to transport at discharge.  No further ICM needs noted.      Final next level of care: Home/Self Care Barriers to Discharge: Barriers Resolved   Patient Goals and CMS Choice Patient states their goals for this hospitalization and ongoing recovery are:: To return home CMS Medicare.gov Compare Post Acute Care list provided to:: Patient Choice offered to / list presented to : Patient, Adult Children Charlann)      Discharge Placement                Patient to be transferred to facility by: Daughter Name of family member notified: [facility]    Discharge Plan and Services Additional resources added to the After Visit Summary for                  DME Arranged: N/A DME Agency: NA       HH Arranged: NA HH Agency: NA        Social Drivers of Health (SDOH) Interventions SDOH Screenings   Food Insecurity: No Food Insecurity (07/29/2024)  Housing: Low Risk  (07/29/2024)  Transportation Needs: No Transportation Needs (07/29/2024)  Utilities: Not At Risk (07/29/2024)  Financial Resource Strain: Low Risk  (10/29/2023)   Received from [facility]  Social Connections: Moderately Integrated (07/29/2024)  Tobacco Use: Medium Risk (07/28/2024)     Readmission Risk Interventions    07/30/2024    1:15 PM  Readmission Risk Prevention Plan  Transportation Screening Complete  PCP or Specialist Appt within 5-7 Days Complete  Home Care Screening Complete  Medication Review (RN CM) Referral to Pharmacy
